# Patient Record
Sex: Male | Born: 1985 | Race: White | Hispanic: No | Marital: Married | Smoking: Never smoker
Health system: Southern US, Community
[De-identification: ages and names within clinical notes are randomized; demographics above are authoritative.]

## PROBLEM LIST (undated history)

## (undated) DIAGNOSIS — F988 Other specified behavioral and emotional disorders with onset usually occurring in childhood and adolescence: Secondary | ICD-10-CM

## (undated) DIAGNOSIS — E7800 Pure hypercholesterolemia, unspecified: Secondary | ICD-10-CM

## (undated) DIAGNOSIS — M2550 Pain in unspecified joint: Secondary | ICD-10-CM

## (undated) DIAGNOSIS — F909 Attention-deficit hyperactivity disorder, unspecified type: Secondary | ICD-10-CM

## (undated) DIAGNOSIS — R0602 Shortness of breath: Secondary | ICD-10-CM

## (undated) DIAGNOSIS — R748 Abnormal levels of other serum enzymes: Secondary | ICD-10-CM

## (undated) HISTORY — DX: Pain in unspecified joint: M25.50

## (undated) HISTORY — DX: Attention-deficit hyperactivity disorder, unspecified type: F90.9

## (undated) HISTORY — DX: Abnormal levels of other serum enzymes: R74.8

## (undated) HISTORY — DX: Other specified behavioral and emotional disorders with onset usually occurring in childhood and adolescence: F98.8

## (undated) HISTORY — DX: Pure hypercholesterolemia, unspecified: E78.00

## (undated) HISTORY — PX: KNEE SURGERY: SHX244

## (undated) HISTORY — DX: Shortness of breath: R06.02

---

## 2004-06-18 ENCOUNTER — Ambulatory Visit: Payer: Self-pay | Admitting: Internal Medicine

## 2004-06-24 ENCOUNTER — Ambulatory Visit: Payer: Self-pay | Admitting: Internal Medicine

## 2005-04-30 ENCOUNTER — Ambulatory Visit: Payer: Self-pay | Admitting: Internal Medicine

## 2006-07-26 ENCOUNTER — Ambulatory Visit: Payer: Self-pay | Admitting: Internal Medicine

## 2006-07-28 ENCOUNTER — Ambulatory Visit: Payer: Self-pay | Admitting: Internal Medicine

## 2006-12-21 ENCOUNTER — Encounter: Payer: Self-pay | Admitting: Internal Medicine

## 2006-12-24 ENCOUNTER — Ambulatory Visit: Payer: Self-pay | Admitting: Internal Medicine

## 2010-07-06 HISTORY — PX: SHOULDER SURGERY: SHX246

## 2016-07-24 ENCOUNTER — Encounter (HOSPITAL_COMMUNITY): Payer: Self-pay | Admitting: Emergency Medicine

## 2016-07-24 ENCOUNTER — Inpatient Hospital Stay (HOSPITAL_COMMUNITY)
Admission: EM | Admit: 2016-07-24 | Discharge: 2016-08-05 | DRG: 339 | Disposition: A | Discharge status: Home / Self Care | Payer: 59 | Attending: General Surgery | Admitting: General Surgery

## 2016-07-24 DIAGNOSIS — K567 Ileus, unspecified: Secondary | ICD-10-CM | POA: Diagnosis not present

## 2016-07-24 DIAGNOSIS — Z0189 Encounter for other specified special examinations: Secondary | ICD-10-CM

## 2016-07-24 DIAGNOSIS — Z6829 Body mass index (BMI) 29.0-29.9, adult: Secondary | ICD-10-CM

## 2016-07-24 DIAGNOSIS — R1110 Vomiting, unspecified: Secondary | ICD-10-CM

## 2016-07-24 DIAGNOSIS — K3580 Unspecified acute appendicitis: Secondary | ICD-10-CM | POA: Diagnosis not present

## 2016-07-24 DIAGNOSIS — K3532 Acute appendicitis with perforation and localized peritonitis, without abscess: Secondary | ICD-10-CM | POA: Diagnosis present

## 2016-07-24 DIAGNOSIS — K352 Acute appendicitis with generalized peritonitis: Principal | ICD-10-CM | POA: Diagnosis present

## 2016-07-24 DIAGNOSIS — E7800 Pure hypercholesterolemia, unspecified: Secondary | ICD-10-CM | POA: Diagnosis present

## 2016-07-24 DIAGNOSIS — Z4659 Encounter for fitting and adjustment of other gastrointestinal appliance and device: Secondary | ICD-10-CM

## 2016-07-24 DIAGNOSIS — K381 Appendicular concretions: Secondary | ICD-10-CM | POA: Diagnosis present

## 2016-07-24 DIAGNOSIS — F909 Attention-deficit hyperactivity disorder, unspecified type: Secondary | ICD-10-CM | POA: Diagnosis present

## 2016-07-24 DIAGNOSIS — K9189 Other postprocedural complications and disorders of digestive system: Secondary | ICD-10-CM

## 2016-07-24 DIAGNOSIS — K5669 Other partial intestinal obstruction: Secondary | ICD-10-CM | POA: Diagnosis not present

## 2016-07-24 DIAGNOSIS — K219 Gastro-esophageal reflux disease without esophagitis: Secondary | ICD-10-CM | POA: Diagnosis not present

## 2016-07-24 DIAGNOSIS — R112 Nausea with vomiting, unspecified: Secondary | ICD-10-CM

## 2016-07-24 LAB — URINALYSIS, ROUTINE W REFLEX MICROSCOPIC
Bacteria, UA: NONE SEEN
Bilirubin Urine: NEGATIVE
GLUCOSE, UA: NEGATIVE mg/dL
KETONES UR: 80 mg/dL — AB
Leukocytes, UA: NEGATIVE
Nitrite: NEGATIVE
PH: 5 (ref 5.0–8.0)
Protein, ur: 30 mg/dL — AB
Specific Gravity, Urine: 1.032 — ABNORMAL HIGH (ref 1.005–1.030)
Squamous Epithelial / LPF: NONE SEEN

## 2016-07-24 LAB — CBC
HCT: 45.9 % (ref 39.0–52.0)
Hemoglobin: 15.7 g/dL (ref 13.0–17.0)
MCH: 29.3 pg (ref 26.0–34.0)
MCHC: 34.2 g/dL (ref 30.0–36.0)
MCV: 85.8 fL (ref 78.0–100.0)
Platelets: 228 10*3/uL (ref 150–400)
RBC: 5.35 MIL/uL (ref 4.22–5.81)
RDW: 13 % (ref 11.5–15.5)
WBC: 17.3 10*3/uL — ABNORMAL HIGH (ref 4.0–10.5)

## 2016-07-24 MED ORDER — ONDANSETRON 4 MG PO TBDP
ORAL_TABLET | ORAL | Status: AC
  Filled 2016-07-24: qty 1

## 2016-07-24 MED ORDER — FENTANYL CITRATE (PF) 100 MCG/2ML IJ SOLN
INTRAMUSCULAR | Status: AC
  Filled 2016-07-24: qty 2

## 2016-07-24 MED ORDER — FENTANYL CITRATE (PF) 100 MCG/2ML IJ SOLN
50.0000 ug | Freq: Once | INTRAMUSCULAR | Status: AC
Start: 2016-07-24 — End: 2016-07-24
  Administered 2016-07-24: 50 ug via INTRAVENOUS

## 2016-07-24 MED ORDER — ONDANSETRON 4 MG PO TBDP
4.0000 mg | ORAL_TABLET | Freq: Once | ORAL | Status: AC | PRN
  Administered 2016-07-24: 4 mg via ORAL

## 2016-07-24 NOTE — ED Notes (Addendum)
Pt complains of dull pain to his stomach for the last week. Pt points to pain on the right side of his belly button. No rebound tenderness. Pt's last full meal was 2 days ago. Pt only able to eat small bites.

## 2016-07-24 NOTE — ED Triage Notes (Signed)
Pt presents to ED for assessment of RLQ pain x 1 week, intensifying significantly tonight.  Pt tender to palpation.  Pt denies any vomiting or diarrhea, but complains of new nausea.  Pt denies any urinary changes.

## 2016-07-24 NOTE — ED Notes (Signed)
Pt became very diaphoretic, pale, and stated he felt like he was going to pass out.  Vitals reassessed and BP 85/63.  Pt taken to room for MD assessment

## 2016-07-25 ENCOUNTER — Emergency Department (HOSPITAL_COMMUNITY): Payer: 59 | Admitting: Anesthesiology

## 2016-07-25 ENCOUNTER — Emergency Department (HOSPITAL_COMMUNITY): Payer: 59

## 2016-07-25 ENCOUNTER — Encounter (HOSPITAL_COMMUNITY): Admission: EM | Disposition: A | Discharge status: Home / Self Care | Payer: Self-pay | Source: Home / Self Care

## 2016-07-25 DIAGNOSIS — K3532 Acute appendicitis with perforation and localized peritonitis, without abscess: Secondary | ICD-10-CM | POA: Diagnosis present

## 2016-07-25 DIAGNOSIS — F909 Attention-deficit hyperactivity disorder, unspecified type: Secondary | ICD-10-CM | POA: Diagnosis present

## 2016-07-25 DIAGNOSIS — R109 Unspecified abdominal pain: Secondary | ICD-10-CM | POA: Diagnosis not present

## 2016-07-25 DIAGNOSIS — R197 Diarrhea, unspecified: Secondary | ICD-10-CM | POA: Diagnosis not present

## 2016-07-25 DIAGNOSIS — R1031 Right lower quadrant pain: Secondary | ICD-10-CM | POA: Diagnosis not present

## 2016-07-25 DIAGNOSIS — R112 Nausea with vomiting, unspecified: Secondary | ICD-10-CM | POA: Diagnosis not present

## 2016-07-25 DIAGNOSIS — E7800 Pure hypercholesterolemia, unspecified: Secondary | ICD-10-CM | POA: Diagnosis present

## 2016-07-25 DIAGNOSIS — K5669 Other partial intestinal obstruction: Secondary | ICD-10-CM | POA: Diagnosis not present

## 2016-07-25 DIAGNOSIS — Z4682 Encounter for fitting and adjustment of non-vascular catheter: Secondary | ICD-10-CM | POA: Diagnosis not present

## 2016-07-25 DIAGNOSIS — R1110 Vomiting, unspecified: Secondary | ICD-10-CM | POA: Diagnosis not present

## 2016-07-25 DIAGNOSIS — K3580 Unspecified acute appendicitis: Secondary | ICD-10-CM | POA: Diagnosis not present

## 2016-07-25 DIAGNOSIS — Z6829 Body mass index (BMI) 29.0-29.9, adult: Secondary | ICD-10-CM | POA: Diagnosis not present

## 2016-07-25 DIAGNOSIS — K381 Appendicular concretions: Secondary | ICD-10-CM | POA: Diagnosis present

## 2016-07-25 DIAGNOSIS — K352 Acute appendicitis with generalized peritonitis: Secondary | ICD-10-CM | POA: Diagnosis not present

## 2016-07-25 DIAGNOSIS — K219 Gastro-esophageal reflux disease without esophagitis: Secondary | ICD-10-CM | POA: Diagnosis not present

## 2016-07-25 HISTORY — PX: LAPAROSCOPIC APPENDECTOMY: SHX408

## 2016-07-25 LAB — COMPREHENSIVE METABOLIC PANEL
ALK PHOS: 124 U/L (ref 38–126)
ALT: 32 U/L (ref 17–63)
AST: 17 U/L (ref 15–41)
Albumin: 4.6 g/dL (ref 3.5–5.0)
Anion gap: 13 (ref 5–15)
BUN: 11 mg/dL (ref 6–20)
CO2: 25 mmol/L (ref 22–32)
Calcium: 9.7 mg/dL (ref 8.9–10.3)
Chloride: 98 mmol/L — ABNORMAL LOW (ref 101–111)
Creatinine, Ser: 1.12 mg/dL (ref 0.61–1.24)
GFR calc Af Amer: >60 mL/min (ref >60)
GLUCOSE: 112 mg/dL — AB (ref 65–99)
Potassium: 3.9 mmol/L (ref 3.5–5.1)
Sodium: 136 mmol/L (ref 135–145)
TOTAL PROTEIN: 7.4 g/dL (ref 6.5–8.1)
Total Bilirubin: 0.6 mg/dL (ref 0.3–1.2)

## 2016-07-25 LAB — LIPASE, BLOOD: Lipase: 18 U/L (ref 11–51)

## 2016-07-25 LAB — I-STAT CG4 LACTIC ACID, ED: LACTIC ACID, VENOUS: 0.88 mmol/L (ref 0.5–1.9)

## 2016-07-25 SURGERY — APPENDECTOMY, LAPAROSCOPIC
Anesthesia: General | Site: Abdomen

## 2016-07-25 MED ORDER — SUFENTANIL CITRATE 50 MCG/ML IV SOLN
INTRAVENOUS | Status: AC
  Filled 2016-07-25: qty 1

## 2016-07-25 MED ORDER — SODIUM CHLORIDE 0.9 % IR SOLN
Status: DC | PRN
  Administered 2016-07-25: 1000 mL

## 2016-07-25 MED ORDER — BUPIVACAINE-EPINEPHRINE 0.25% -1:200000 IJ SOLN
INTRAMUSCULAR | Status: DC | PRN
  Administered 2016-07-25: 14 mL

## 2016-07-25 MED ORDER — ROCURONIUM BROMIDE 50 MG/5ML IV SOSY
PREFILLED_SYRINGE | INTRAVENOUS | Status: AC
  Filled 2016-07-25: qty 5

## 2016-07-25 MED ORDER — MIDAZOLAM HCL 2 MG/2ML IJ SOLN
INTRAMUSCULAR | Status: DC | PRN
  Administered 2016-07-25: 2 mg via INTRAVENOUS

## 2016-07-25 MED ORDER — ONDANSETRON HCL 4 MG/2ML IJ SOLN
4.0000 mg | Freq: Four times a day (QID) | INTRAMUSCULAR | Status: DC | PRN
  Administered 2016-07-26 – 2016-08-02 (×10): 4 mg via INTRAVENOUS
  Filled 2016-07-25 (×12): qty 2

## 2016-07-25 MED ORDER — ONDANSETRON 4 MG PO TBDP
4.0000 mg | ORAL_TABLET | Freq: Four times a day (QID) | ORAL | Status: DC | PRN
Start: 2016-07-25 — End: 2016-08-05
  Filled 2016-07-25: qty 1

## 2016-07-25 MED ORDER — METHOCARBAMOL 500 MG PO TABS
500.0000 mg | ORAL_TABLET | Freq: Four times a day (QID) | ORAL | Status: DC | PRN
  Administered 2016-07-26: 500 mg via ORAL
  Filled 2016-07-25: qty 1

## 2016-07-25 MED ORDER — SUFENTANIL CITRATE 50 MCG/ML IV SOLN
INTRAVENOUS | Status: DC | PRN
  Administered 2016-07-25: 10 ug via INTRAVENOUS
  Administered 2016-07-25 (×2): 20 ug via INTRAVENOUS

## 2016-07-25 MED ORDER — SODIUM CHLORIDE 0.9 % IJ SOLN
INTRAMUSCULAR | Status: AC
  Filled 2016-07-25: qty 10

## 2016-07-25 MED ORDER — OXYCODONE HCL 5 MG PO TABS
5.0000 mg | ORAL_TABLET | ORAL | Status: DC | PRN
  Administered 2016-07-25 – 2016-07-26 (×3): 10 mg via ORAL
  Administered 2016-07-27: 5 mg via ORAL
  Administered 2016-07-27 – 2016-08-02 (×4): 10 mg via ORAL
  Filled 2016-07-25 (×3): qty 2
  Filled 2016-07-25: qty 1
  Filled 2016-07-25 (×6): qty 2

## 2016-07-25 MED ORDER — MORPHINE SULFATE (PF) 4 MG/ML IV SOLN
4.0000 mg | INTRAVENOUS | Status: DC | PRN
  Administered 2016-07-25 (×2): 4 mg via INTRAVENOUS
  Filled 2016-07-25 (×2): qty 1

## 2016-07-25 MED ORDER — ZOLPIDEM TARTRATE 5 MG PO TABS: 5.0000 mg | ORAL_TABLET | Freq: Every evening | ORAL | Status: DC | PRN

## 2016-07-25 MED ORDER — IOPAMIDOL (ISOVUE-300) INJECTION 61%
INTRAVENOUS | Status: AC
  Administered 2016-07-25: 100 mL
  Filled 2016-07-25: qty 100

## 2016-07-25 MED ORDER — ONDANSETRON HCL 4 MG/2ML IJ SOLN
INTRAMUSCULAR | Status: DC | PRN
  Administered 2016-07-25: 4 mg via INTRAVENOUS

## 2016-07-25 MED ORDER — SUCCINYLCHOLINE CHLORIDE 200 MG/10ML IV SOSY
PREFILLED_SYRINGE | INTRAVENOUS | Status: AC
  Filled 2016-07-25: qty 10

## 2016-07-25 MED ORDER — PIPERACILLIN-TAZOBACTAM 3.375 G IVPB
3.3750 g | Freq: Three times a day (TID) | INTRAVENOUS | Status: AC
  Administered 2016-07-25 – 2016-07-29 (×12): 3.375 g via INTRAVENOUS
  Filled 2016-07-25 (×12): qty 50

## 2016-07-25 MED ORDER — LIDOCAINE 2% (20 MG/ML) 5 ML SYRINGE
INTRAMUSCULAR | Status: AC
  Filled 2016-07-25: qty 5

## 2016-07-25 MED ORDER — BUPIVACAINE HCL (PF) 0.25 % IJ SOLN
INTRAMUSCULAR | Status: AC
  Filled 2016-07-25: qty 30

## 2016-07-25 MED ORDER — 0.9 % SODIUM CHLORIDE (POUR BTL) OPTIME
TOPICAL | Status: DC | PRN
  Administered 2016-07-25: 1000 mL

## 2016-07-25 MED ORDER — SUGAMMADEX SODIUM 200 MG/2ML IV SOLN
INTRAVENOUS | Status: DC | PRN
  Administered 2016-07-25: 200 mg via INTRAVENOUS

## 2016-07-25 MED ORDER — HYDROMORPHONE HCL 1 MG/ML IJ SOLN
0.2500 mg | INTRAMUSCULAR | Status: DC | PRN
  Administered 2016-07-25 (×2): 0.5 mg via INTRAVENOUS

## 2016-07-25 MED ORDER — MEPERIDINE HCL 25 MG/ML IJ SOLN: 6.2500 mg | INTRAMUSCULAR | Status: DC | PRN

## 2016-07-25 MED ORDER — PROPOFOL 10 MG/ML IV BOLUS
INTRAVENOUS | Status: AC
  Filled 2016-07-25: qty 20

## 2016-07-25 MED ORDER — ONDANSETRON HCL 4 MG/2ML IJ SOLN
INTRAMUSCULAR | Status: AC
  Filled 2016-07-25: qty 2

## 2016-07-25 MED ORDER — SUGAMMADEX SODIUM 200 MG/2ML IV SOLN
INTRAVENOUS | Status: AC
  Filled 2016-07-25: qty 2

## 2016-07-25 MED ORDER — ONDANSETRON HCL 4 MG/2ML IJ SOLN
4.0000 mg | Freq: Once | INTRAMUSCULAR | Status: AC
  Administered 2016-07-25: 4 mg via INTRAVENOUS
  Filled 2016-07-25: qty 2

## 2016-07-25 MED ORDER — ACETAMINOPHEN 500 MG PO TABS
1000.0000 mg | ORAL_TABLET | Freq: Four times a day (QID) | ORAL | Status: DC
  Administered 2016-07-25 – 2016-08-04 (×29): 1000 mg via ORAL
  Filled 2016-07-25 (×29): qty 2

## 2016-07-25 MED ORDER — ATORVASTATIN CALCIUM 20 MG PO TABS
20.0000 mg | ORAL_TABLET | Freq: Every day | ORAL | Status: DC
  Administered 2016-07-25 – 2016-08-05 (×9): 20 mg via ORAL
  Filled 2016-07-25 (×10): qty 1

## 2016-07-25 MED ORDER — POLYETHYLENE GLYCOL 3350 17 G PO PACK: 17.0000 g | PACK | Freq: Every day | ORAL | Status: DC | PRN

## 2016-07-25 MED ORDER — ROCURONIUM BROMIDE 100 MG/10ML IV SOLN
INTRAVENOUS | Status: DC | PRN
  Administered 2016-07-25: 30 mg via INTRAVENOUS
  Administered 2016-07-25: 20 mg via INTRAVENOUS

## 2016-07-25 MED ORDER — LACTATED RINGERS IV SOLN
INTRAVENOUS | Status: DC | PRN
  Administered 2016-07-25 (×2): via INTRAVENOUS

## 2016-07-25 MED ORDER — PIPERACILLIN-TAZOBACTAM 3.375 G IVPB
3.3750 g | Freq: Once | INTRAVENOUS | Status: AC
  Administered 2016-07-25: 3.375 g via INTRAVENOUS
  Filled 2016-07-25: qty 50

## 2016-07-25 MED ORDER — AMPHETAMINE-DEXTROAMPHET ER 10 MG PO CP24: 10.0000 mg | ORAL_CAPSULE | Freq: Every day | ORAL | Status: DC | PRN

## 2016-07-25 MED ORDER — SODIUM CHLORIDE 0.9 % IV BOLUS (SEPSIS)
1000.0000 mL | Freq: Once | INTRAVENOUS | Status: AC
  Administered 2016-07-25: 1000 mL via INTRAVENOUS

## 2016-07-25 MED ORDER — SUCCINYLCHOLINE CHLORIDE 20 MG/ML IJ SOLN
INTRAMUSCULAR | Status: DC | PRN
  Administered 2016-07-25: 140 mg via INTRAVENOUS

## 2016-07-25 MED ORDER — MIDAZOLAM HCL 2 MG/2ML IJ SOLN
INTRAMUSCULAR | Status: AC
  Filled 2016-07-25: qty 2

## 2016-07-25 MED ORDER — HYDROMORPHONE HCL 2 MG/ML IJ SOLN
1.0000 mg | INTRAMUSCULAR | Status: DC | PRN
  Administered 2016-07-25 – 2016-07-26 (×8): 2 mg via INTRAVENOUS
  Filled 2016-07-25 (×9): qty 1

## 2016-07-25 MED ORDER — DEXTROSE 5 % IV SOLN
2.0000 g | Freq: Once | INTRAVENOUS | Status: AC
  Administered 2016-07-25: 2 g via INTRAVENOUS
  Filled 2016-07-25: qty 2

## 2016-07-25 MED ORDER — DOCUSATE SODIUM 100 MG PO CAPS
100.0000 mg | ORAL_CAPSULE | Freq: Two times a day (BID) | ORAL | Status: DC
  Administered 2016-07-25 – 2016-07-28 (×7): 100 mg via ORAL
  Filled 2016-07-25 (×8): qty 1

## 2016-07-25 MED ORDER — LIDOCAINE HCL (CARDIAC) 20 MG/ML IV SOLN
INTRAVENOUS | Status: DC | PRN
  Administered 2016-07-25: 100 mg via INTRATRACHEAL

## 2016-07-25 MED ORDER — ENOXAPARIN SODIUM 40 MG/0.4ML ~~LOC~~ SOLN
40.0000 mg | SUBCUTANEOUS | Status: DC
  Administered 2016-07-26 – 2016-08-04 (×10): 40 mg via SUBCUTANEOUS
  Filled 2016-07-25 (×11): qty 0.4

## 2016-07-25 MED ORDER — PROPOFOL 10 MG/ML IV BOLUS
INTRAVENOUS | Status: DC | PRN
  Administered 2016-07-25: 150 mg via INTRAVENOUS

## 2016-07-25 MED ORDER — HYDROMORPHONE HCL 1 MG/ML IJ SOLN
INTRAMUSCULAR | Status: AC
  Filled 2016-07-25: qty 1

## 2016-07-25 MED ORDER — ONDANSETRON HCL 4 MG/2ML IJ SOLN: 4.0000 mg | Freq: Once | INTRAMUSCULAR | Status: DC | PRN

## 2016-07-25 SURGICAL SUPPLY — 51 items
ADH SKN CLS APL DERMABOND .7 (GAUZE/BANDAGES/DRESSINGS) ×1
APPLIER CLIP ROT 10 11.4 M/L (STAPLE)
APR CLP MED LRG 11.4X10 (STAPLE)
BAG SPEC RTRVL LRG 6X4 10 (ENDOMECHANICALS) ×2
BLADE SURG ROTATE 9660 (MISCELLANEOUS) ×1 IMPLANT
CANISTER SUCTION 2500CC (MISCELLANEOUS) ×2 IMPLANT
CHLORAPREP W/TINT 26ML (MISCELLANEOUS) ×2 IMPLANT
CLIP APPLIE ROT 10 11.4 M/L (STAPLE) IMPLANT
CLOSURE STERI-STRIP 1/4X4 (GAUZE/BANDAGES/DRESSINGS) ×1 IMPLANT
COVER SURGICAL LIGHT HANDLE (MISCELLANEOUS) ×2 IMPLANT
CUTTER FLEX LINEAR 45M (STAPLE) ×2 IMPLANT
DERMABOND ADVANCED (GAUZE/BANDAGES/DRESSINGS) ×1
DERMABOND ADVANCED .7 DNX12 (GAUZE/BANDAGES/DRESSINGS) ×1 IMPLANT
DRSG TEGADERM 2-3/8X2-3/4 SM (GAUZE/BANDAGES/DRESSINGS) ×6 IMPLANT
DRSG TEGADERM 4X4.75 (GAUZE/BANDAGES/DRESSINGS) ×1 IMPLANT
ELECT REM PT RETURN 9FT ADLT (ELECTROSURGICAL) ×2
ELECTRODE REM PT RTRN 9FT ADLT (ELECTROSURGICAL) ×1 IMPLANT
ENDOLOOP SUT PDS II  0 18 (SUTURE)
ENDOLOOP SUT PDS II 0 18 (SUTURE) IMPLANT
GLOVE BIO SURGEON STRL SZ 6.5 (GLOVE) ×2 IMPLANT
GLOVE BIOGEL PI IND STRL 6.5 (GLOVE) IMPLANT
GLOVE BIOGEL PI IND STRL 8 (GLOVE) ×1 IMPLANT
GLOVE BIOGEL PI INDICATOR 6.5 (GLOVE) ×1
GLOVE BIOGEL PI INDICATOR 8 (GLOVE) ×1
GLOVE ECLIPSE 7.5 STRL STRAW (GLOVE) ×2 IMPLANT
GOWN STRL REUS W/ TWL LRG LVL3 (GOWN DISPOSABLE) ×3 IMPLANT
GOWN STRL REUS W/TWL LRG LVL3 (GOWN DISPOSABLE) ×6
IV NS 1000ML (IV SOLUTION) ×6
IV NS 1000ML BAXH (IV SOLUTION) IMPLANT
KIT BASIN OR (CUSTOM PROCEDURE TRAY) ×2 IMPLANT
KIT ROOM TURNOVER OR (KITS) ×2 IMPLANT
NS IRRIG 1000ML POUR BTL (IV SOLUTION) ×2 IMPLANT
PAD ARMBOARD 7.5X6 YLW CONV (MISCELLANEOUS) ×4 IMPLANT
POUCH SPECIMEN RETRIEVAL 10MM (ENDOMECHANICALS) ×3 IMPLANT
RELOAD 45 VASCULAR/THIN (ENDOMECHANICALS) IMPLANT
RELOAD STAPLE 45 2.5 WHT GRN (ENDOMECHANICALS) IMPLANT
RELOAD STAPLE 45 3.5 BLU ETS (ENDOMECHANICALS) IMPLANT
RELOAD STAPLE TA45 3.5 REG BLU (ENDOMECHANICALS) ×6 IMPLANT
SCALPEL HARMONIC ACE (MISCELLANEOUS) ×2 IMPLANT
SET IRRIG TUBING LAPAROSCOPIC (IRRIGATION / IRRIGATOR) ×2 IMPLANT
SLEEVE ENDOPATH XCEL 5M (ENDOMECHANICALS) ×2 IMPLANT
SPECIMEN JAR SMALL (MISCELLANEOUS) ×2 IMPLANT
STRIP CLOSURE SKIN 1/2X4 (GAUZE/BANDAGES/DRESSINGS) ×2 IMPLANT
SUT MNCRL AB 4-0 PS2 18 (SUTURE) ×2 IMPLANT
TOWEL OR 17X24 6PK STRL BLUE (TOWEL DISPOSABLE) ×2 IMPLANT
TOWEL OR 17X26 10 PK STRL BLUE (TOWEL DISPOSABLE) ×2 IMPLANT
TRAY FOLEY CATH 16FR SILVER (SET/KITS/TRAYS/PACK) ×2 IMPLANT
TRAY LAPAROSCOPIC MC (CUSTOM PROCEDURE TRAY) ×2 IMPLANT
TROCAR XCEL BLUNT TIP 100MML (ENDOMECHANICALS) ×2 IMPLANT
TROCAR XCEL NON-BLD 5MMX100MML (ENDOMECHANICALS) ×2 IMPLANT
TUBING INSUFFLATION (TUBING) ×2 IMPLANT

## 2016-07-25 NOTE — Anesthesia Preprocedure Evaluation (Signed)
Anesthesia Evaluation  Patient identified by MRN, date of birth, ID band Patient awake    Reviewed: Allergy & Precautions, NPO status , Patient's Chart, lab work & pertinent test results  Airway Mallampati: I  TM Distance: >3 FB Neck ROM: Full    Dental   Pulmonary    Pulmonary exam normal       Cardiovascular Normal cardiovascular exam    Neuro/Psych    GI/Hepatic   Endo/Other    Renal/GU      Musculoskeletal   Abdominal   Peds  Hematology   Anesthesia Other Findings   Reproductive/Obstetrics                             Anesthesia Physical Anesthesia Plan  ASA: II and emergent  Anesthesia Plan: General   Post-op Pain Management:    Induction: Intravenous, Rapid sequence and Cricoid pressure planned  Airway Management Planned: Oral ETT  Additional Equipment:   Intra-op Plan:   Post-operative Plan: Extubation in OR  Informed Consent: I have reviewed the patients History and Physical, chart, labs and discussed the procedure including the risks, benefits and alternatives for the proposed anesthesia with the patient or authorized representative who has indicated his/her understanding and acceptance.     Plan Discussed with: CRNA and Surgeon  Anesthesia Plan Comments:         Anesthesia Quick Evaluation  

## 2016-07-25 NOTE — H&P (Signed)
Ronnie Jordan is an 31 y.o. male.   Chief Complaint: Abdominal pain, acute appendicitis HPI: Patient has been sick for over one week with abdominal pain, worsened so much the last 24 hours that he came to the ED where he was suspectd of having acute appendicitis.  CT scan confirmed the diagnosis with possible perforation.  History reviewed. No pertinent past medical history.  History reviewed. No pertinent surgical history.  History reviewed. No pertinent family history. Social History:  reports that he has never smoked. He has never used smokeless tobacco. He reports that he does not drink alcohol or use drugs.  Allergies: No Known Allergies   (Not in a hospital admission)  Results for orders placed or performed during the hospital encounter of 07/24/16 (from the past 48 hour(s))  Lipase, blood     Status: None   Collection Time: 07/24/16 10:59 PM  Result Value Ref Range   Lipase 18 11 - 51 U/L  Comprehensive metabolic panel     Status: Abnormal   Collection Time: 07/24/16 10:59 PM  Result Value Ref Range   Sodium 136 135 - 145 mmol/L   Potassium 3.9 3.5 - 5.1 mmol/L   Chloride 98 (L) 101 - 111 mmol/L   CO2 25 22 - 32 mmol/L   Glucose, Bld 112 (H) 65 - 99 mg/dL   BUN 11 6 - 20 mg/dL   Creatinine, Ser 1.12 0.61 - 1.24 mg/dL   Calcium 9.7 8.9 - 10.3 mg/dL   Total Protein 7.4 6.5 - 8.1 g/dL   Albumin 4.6 3.5 - 5.0 g/dL   AST 17 15 - 41 U/L   ALT 32 17 - 63 U/L   Alkaline Phosphatase 124 38 - 126 U/L   Total Bilirubin 0.6 0.3 - 1.2 mg/dL   GFR calc non Af Amer >60 >60 mL/min   GFR calc Af Amer >60 >60 mL/min    Comment: (NOTE) The eGFR has been calculated using the CKD EPI equation. This calculation has not been validated in all clinical situations. eGFR's persistently <60 mL/min signify possible Chronic Kidney Disease.    Anion gap 13 5 - 15  CBC     Status: Abnormal   Collection Time: 07/24/16 10:59 PM  Result Value Ref Range   WBC 17.3 (H) 4.0 - 10.5 K/uL   RBC 5.35  4.22 - 5.81 MIL/uL   Hemoglobin 15.7 13.0 - 17.0 g/dL   HCT 45.9 39.0 - 52.0 %   MCV 85.8 78.0 - 100.0 fL   MCH 29.3 26.0 - 34.0 pg   MCHC 34.2 30.0 - 36.0 g/dL   RDW 13.0 11.5 - 15.5 %   Platelets 228 150 - 400 K/uL  Urinalysis, Routine w reflex microscopic     Status: Abnormal   Collection Time: 07/24/16 11:01 PM  Result Value Ref Range   Color, Urine YELLOW YELLOW   APPearance CLEAR CLEAR   Specific Gravity, Urine 1.032 (H) 1.005 - 1.030   pH 5.0 5.0 - 8.0   Glucose, UA NEGATIVE NEGATIVE mg/dL   Hgb urine dipstick SMALL (A) NEGATIVE   Bilirubin Urine NEGATIVE NEGATIVE   Ketones, ur 80 (A) NEGATIVE mg/dL   Protein, ur 30 (A) NEGATIVE mg/dL   Nitrite NEGATIVE NEGATIVE   Leukocytes, UA NEGATIVE NEGATIVE   RBC / HPF 0-5 0 - 5 RBC/hpf   WBC, UA 0-5 0 - 5 WBC/hpf   Bacteria, UA NONE SEEN NONE SEEN   Squamous Epithelial / LPF NONE SEEN NONE SEEN  Mucous PRESENT   I-Stat CG4 Lactic Acid, ED     Status: None   Collection Time: 07/25/16 12:37 AM  Result Value Ref Range   Lactic Acid, Venous 0.88 0.5 - 1.9 mmol/L   Ct Abdomen Pelvis W Contrast  Result Date: 07/25/2016 CLINICAL DATA:  Abdominal pain with hypotension EXAM: CT ABDOMEN AND PELVIS WITH CONTRAST TECHNIQUE: Multidetector CT imaging of the abdomen and pelvis was performed using the standard protocol following bolus administration of intravenous contrast. CONTRAST:  160m ISOVUE-300 IOPAMIDOL (ISOVUE-300) INJECTION 61% COMPARISON:  None. FINDINGS: Lower chest: Lung bases demonstrate no acute consolidation or pleural effusion. Normal heart size. Hepatobiliary: Subcentimeter hypodense lesion in the right hepatic lobe too small to further characterize. Small focal fat infiltration near the falciform ligament. No biliary dilatation. No calcified gallstones. Pancreas: Unremarkable. No pancreatic ductal dilatation or surrounding inflammatory changes. Spleen: Normal in size without focal abnormality. Adrenals/Urinary Tract: Adrenal glands  are unremarkable. Kidneys are normal, without renal calculi, focal lesion, or hydronephrosis. Bladder is unremarkable. Stomach/Bowel: Stomach is nonenlarged. There is no dilated small bowel. Marked inflammatory process in the right lower quadrant. The appendix is enlarged, measuring up to 1.7 cm. There is a 6 mm appendicolith within the proximal lumen of the appendix. No definite extraluminal gas. Thickening of the adjacent distal small bowel, likely reactive. Vascular/Lymphatic: Enlarge right lower quadrant mesenteric lymph nodes. Reproductive: Prostate is unremarkable. Other: Small free fluid in the pelvis.  No free air Musculoskeletal: No acute or significant osseous findings. IMPRESSION: 1. Enlarged appendix measuring up to 17 mm with moderate to marked inflammatory changes in the right lower quadrant, findings would be consistent with appendicitis. There is a 6 mm at appendicolith in the proximal appendiceal lumen. No definite extraluminal gas is seen in the right lower quadrant. 2. Small amount of free fluid in the pelvis Critical Value/emergent results were called by telephone at the time of interpretation on 07/25/2016 at 1:54 am to Dr. HAbigail Butts, who verbally acknowledged these results. Electronically Signed   By: KDonavan FoilM.D.   On: 07/25/2016 01:54    Review of Systems  Constitutional: Positive for chills and fever.  Gastrointestinal: Positive for abdominal pain and nausea.  All other systems reviewed and are negative.   Blood pressure 134/80, pulse 92, temperature 99.1 F (37.3 C), temperature source Oral, resp. rate 19, height '5\' 11"'$  (1.803 m), weight 88.1 kg (194 lb 4 oz), SpO2 99 %. Physical Exam  Nursing note and vitals reviewed. Constitutional: He is oriented to person, place, and time. He appears well-developed and well-nourished.  HENT:  Head: Normocephalic and atraumatic.  Eyes: Conjunctivae and EOM are normal. Pupils are equal, round, and reactive to light.  Neck:  Normal range of motion. Neck supple.  Cardiovascular: Normal rate, regular rhythm, normal heart sounds and intact distal pulses.   Respiratory: Effort normal and breath sounds normal.  GI: Soft. Normal appearance and bowel sounds are normal. He exhibits no distension. There is tenderness in the right upper quadrant and right lower quadrant. There is rebound (Has a positive Rovsing's sign), guarding and tenderness at McBurney's point (maybe a bit lower). There is no rigidity.  Musculoskeletal: Normal range of motion.  Neurological: He is alert and oriented to person, place, and time. He has normal reflexes.  Skin: Skin is warm and dry.  Psychiatric: He has a normal mood and affect. His behavior is normal. Judgment and thought content normal.     Assessment/Plan Acute appendicitis with possible early rupture and  an appendicolith OR ASAP laparoscopic appendectomy. Zosyn and Rocephin ordered for the OR.  Judeth Horn, MD 07/25/2016, 2:33 AM

## 2016-07-25 NOTE — Anesthesia Postprocedure Evaluation (Signed)
Anesthesia Post Note  Patient: Ronnie Jordan  Procedure(s) Performed: Procedure(s) (LRB): APPENDECTOMY LAPAROSCOPIC (N/A)  Patient location during evaluation: PACU Anesthesia Type: General Level of consciousness: awake and alert Pain management: pain level controlled Vital Signs Assessment: post-procedure vital signs reviewed and stable Respiratory status: spontaneous breathing, nonlabored ventilation, respiratory function stable and patient connected to nasal cannula oxygen Cardiovascular status: blood pressure returned to baseline and stable Postop Assessment: no signs of nausea or vomiting Anesthetic complications: no       Last Vitals:  Vitals:   07/25/16 0530 07/25/16 0613  BP: 120/79 117/66  Pulse:  93  Resp:    Temp: 37.6 C 36.9 C    Last Pain:  Vitals:   07/25/16 0613  TempSrc: Oral  PainSc:                  Lanee Chain DAVID

## 2016-07-25 NOTE — Op Note (Signed)
OPERATIVE REPORT  DATE OF OPERATION:  07/25/2016  PATIENT:  Ronnie Jordan  31 y.o. male  PRE-OPERATIVE DIAGNOSIS:  Acute appendicitis  POST-OPERATIVE DIAGNOSIS:  Acute appendicitis  INDICATION(S) FOR OPERATION:  Abdominal pain for one week with CT scan positive for acute appendicitis and possible rupture  FINDINGS:  Rupture acute suppurative appendicitis  PROCEDURE:  Procedure(s): APPENDECTOMY LAPAROSCOPIC  SURGEON:  Surgeon(s): Ronnie Norman, MD  ASSISTANT: None  ANESTHESIA:   general  COMPLICATIONS:  RUpture  EBL: 25-50 ml  BLOOD ADMINISTERED: none  DRAINS: none   SPECIMEN:  Source of Specimen:  Appendix  COUNTS CORRECT:  YES  PROCEDURE DETAILS: The patient was taken to the operating room and placed on the table in the supine position. After an adequate general endotracheal anesthetic was administered, he was prepped and draped in the usual sterile manner exposing his abdomen.  A proper timeout was performed identifying the patient and the procedure to be performed. We started with a supraumbilical midline incision approximately 1 cm long down into the subcutaneous tissue. We dissected down to the midline fascia which was subsequently incised with a 15 blade. We subsequently bluntly dissected down into the peritoneal cavity with a Kelly clamp. A pursestring suture of 0 Vicryl was passed around the fascial opening which secured in place a Hassan cannula. Through the [facility] cannula carbon dioxide gas was insufflated into the peritoneal cavity up to a maximal intra-abdominal pressure of 15 mmHg.  We subsequently placed two 5 mm cannulas one in the right upper quadrant and one in the left lower quadrant . With all cannulas in place the patient was placed in Trendelenburg position and the left side was tilted down.  There was omentum adherent to the anterior surface of the appendix that was also densely attached to the lateral abdominal wall and posteriorly. It was not a  retrocecal appendix but the inflammatory adhesions were very dense. Also the terminal ileum partly covered the appendix on that side as there was an abscess cavity covering an area of rupture in the mid to distal portion of the appendix. Associated with that were several pieces of an appendicolith there are floating in the milieu of pus.  We dissected the appendix away from the lateral wall and posteriorly and also bluntly dissected the terminal ileum away from the medial aspect of the appendix. The rupture was in the mid to distal portion of the appendix and we were able to dissect out the appendix just proximal to that taking down the mesoappendix along with that.  We subsequently used a blue cartridge Endo GIA to come across appendix just proximal to the area of rupture. Upon inspecting that area after removing this portion of the appendix using an Endo Catch bag, it was noted that there was residual appendix left which we had to go back and take down using 2 more blue cartridge Endo GIA's. Removing the previously rupture port allowed [place] to visualize the base of the appendix at the cecum much more clearly than initially.  The second blue cartridge Endo GIA was taken across the more proximal portion of the appendix but this still left a residual stump of approximately 2 cm. A third Endo GIA was fired across the base of the appendix at the cecum providing a flushed detachment of the appendix.  Once this was done we inspected the area for bleeding and any residual abscess cavities. There was minimal bleeding as we used the harmonic scalpel for dissecting out the mesoappendix. Was also no  residual purulent areas.  We irrigated this area with 3 L of saline solution prior to closure. We attempted aspirated all fluid and gas from the pelvis and above the liver prior to closure. The patient was placed in the neutral position prior to moving in the cannulas. We initially removed the supraumbilical cannula and  tied off the fascia using the pursestring suture which was in place.  Once this was done we aspirated all fluid and gas from above the liver. Removed all cannulas.  We irrigated the wounds with saline solution. The supraumbilical skin was closed using a running subcuticular stitch of 4-0 Monocryl. 0.25% Marcaine with epinephrine was injected at all sites. The closures were done were Dermabond Steri-Strips and Tegaderm. All needle counts, sponge counts, and instrument counts were correct.  PATIENT DISPOSITION:  PACU - hemodynamically stable.   Ronnie Jordan 1/20/20185:08 AM

## 2016-07-25 NOTE — ED Provider Notes (Signed)
[facility]-EMERGENCY DEPT Provider Note   CSN: [id number] Arrival date & time: 07/24/16  2248     History   Chief Complaint Chief Complaint  Patient presents with  . Abdominal Pain    HPI Ronnie Jordan is a 31 y.o. male with a hx of ADHD, high cholesterol presents to the Emergency Department complaining of gradual, persistent, progressively worsening RLQ abd pain that began periumbilical and moved distal onset 1 week ago. Associated symptoms include vomiting (NBNB), chills.  No treatments PTA.  Nothing makes it better and movement, palpation makes it worse.  Pt denies fever, chest pain, SOB, diarrhea, constipation.  Pt reports no radiation of the pain. Pt denies previous abd surgeries.      The history is provided by the patient and medical records. No language interpreter was used.    History reviewed. No pertinent past medical history.  There are no active problems to display for this patient.   History reviewed. No pertinent surgical history.     Home Medications    Prior to Admission medications   Medication Sig Start Date End Date Taking? Authorizing Provider  amphetamine-dextroamphetamine (ADDERALL XR) 10 MG 24 hr capsule Take 10 mg by mouth daily as needed (ADHD).   Yes Historical Provider, MD  atorvastatin (LIPITOR) 20 MG tablet Take 20 mg by mouth daily.   Yes Historical Provider, MD    Family History History reviewed. No pertinent family history.  Social History Social History  Substance Use Topics  . Smoking status: Never Smoker  . Smokeless tobacco: Never Used  . Alcohol use No     Allergies   Patient has no known allergies.   Review of Systems Review of Systems  Gastrointestinal: Positive for abdominal pain (RLQ), nausea and vomiting.  All other systems reviewed and are negative.    Physical Exam Updated Vital Signs BP 125/75 (BP Location: Right Arm)   Pulse 101   Temp 99.1 F (37.3 C) (Oral)   Resp 18   Ht 5\' 11"  (1.803 m)   Wt 88.1 kg    SpO2 98%   BMI 27.09 kg/m   Physical Exam  Constitutional: He appears well-developed and well-nourished. No distress.  Awake, alert, nontoxic appearance  HENT:  Head: Normocephalic and atraumatic.  Mouth/Throat: Oropharynx is clear and moist. No oropharyngeal exudate.  Eyes: Conjunctivae are normal. No scleral icterus.  Neck: Normal range of motion. Neck supple.  Cardiovascular: Normal rate, regular rhythm and intact distal pulses.   Pulmonary/Chest: Effort normal and breath sounds normal. No respiratory distress. He has no wheezes.  Equal chest expansion  Abdominal: Soft. Bowel sounds are normal. He exhibits no distension and no mass. There is tenderness. There is rebound, guarding and tenderness at McBurney's point. There is no rigidity and negative Murphy's sign.  Positive psoas and obturator signs  Musculoskeletal: Normal range of motion. He exhibits no edema.  Neurological: He is alert.  Speech is clear and goal oriented Moves extremities without ataxia  Skin: Skin is warm and dry. He is not diaphoretic.  Psychiatric: He has a normal mood and affect.  Nursing note and vitals reviewed.    ED Treatments / Results  Labs (all labs ordered are listed, but only abnormal results are displayed) Labs Reviewed  COMPREHENSIVE METABOLIC PANEL - Abnormal; Notable for the following:       Result Value   Chloride 98 (*)    Glucose, Bld 112 (*)    All other components within normal limits  CBC - Abnormal; Notable  for the following:    WBC 17.3 (*)    All other components within normal limits  URINALYSIS, ROUTINE W REFLEX MICROSCOPIC - Abnormal; Notable for the following:    Specific Gravity, Urine 1.032 (*)    Hgb urine dipstick SMALL (*)    Ketones, ur 80 (*)    Protein, ur 30 (*)    All other components within normal limits  LIPASE, BLOOD  I-STAT CG4 LACTIC ACID, ED    Radiology Ct Abdomen Pelvis W Contrast  Result Date: 07/25/2016 CLINICAL DATA:  Abdominal pain with  hypotension EXAM: CT ABDOMEN AND PELVIS WITH CONTRAST TECHNIQUE: Multidetector CT imaging of the abdomen and pelvis was performed using the standard protocol following bolus administration of intravenous contrast. CONTRAST:  ISOVUE-300 IOPAMIDOL (ISOVUE-300) INJECTION 61% COMPARISON:  None. FINDINGS: Lower chest: Lung bases demonstrate no acute consolidation or pleural effusion. Normal heart size. Hepatobiliary: Subcentimeter hypodense lesion in the right hepatic lobe too small to further characterize. Small focal fat infiltration near the falciform ligament. No biliary dilatation. No calcified gallstones. Pancreas: Unremarkable. No pancreatic ductal dilatation or surrounding inflammatory changes. Spleen: Normal in size without focal abnormality. Adrenals/Urinary Tract: Adrenal glands are unremarkable. Kidneys are normal, without renal calculi, focal lesion, or hydronephrosis. Bladder is unremarkable. Stomach/Bowel: Stomach is nonenlarged. There is no dilated small bowel. Marked inflammatory process in the right lower quadrant. The appendix is enlarged, measuring up to 1.7 cm. There is a 6 mm appendicolith within the proximal lumen of the appendix. No definite extraluminal gas. Thickening of the adjacent distal small bowel, likely reactive. Vascular/Lymphatic: Enlarge right lower quadrant mesenteric lymph nodes. Reproductive: Prostate is unremarkable. Other: Small free fluid in the pelvis.  No free air Musculoskeletal: No acute or significant osseous findings. IMPRESSION: 1. Enlarged appendix measuring up to 17 mm with moderate to marked inflammatory changes in the right lower quadrant, findings would be consistent with appendicitis. There is a 6 mm at appendicolith in the proximal appendiceal lumen. No definite extraluminal gas is seen in the right lower quadrant. 2. Small amount of free fluid in the pelvis Critical Value/emergent results were called by telephone at the time of interpretation on 07/25/2016 at  1:54 am to Dr. Dierdre Forth , who verbally acknowledged these results. Electronically Signed   By: Jasmine Pang M.D.   On: 07/25/2016 01:54    Procedures Procedures (including critical care time)  Medications Ordered in ED Medications  morphine 4 MG/ML injection 4 mg (4 mg Intravenous Given 07/25/16 0230)  cefTRIAXone (ROCEPHIN) 2 g in dextrose 5 % 50 mL IVPB (not administered)    And  piperacillin-tazobactam (ZOSYN) IVPB 3.375 g (not administered)  fentaNYL (SUBLIMAZE) injection 50 mcg (50 mcg Intravenous Given 07/24/16 2302)  ondansetron (ZOFRAN-ODT) disintegrating tablet 4 mg (4 mg Oral Given 07/24/16 2301)  sodium chloride 0.9 % bolus 1,000 mL (1,000 mLs Intravenous New Bag/Given 07/25/16 0046)  iopamidol (ISOVUE-300) 61 % injection (100 mLs  Contrast Given 07/25/16 0054)  ondansetron (ZOFRAN) injection 4 mg (4 mg Intravenous Given 07/25/16 0113)     Initial Impression / Assessment and Plan / ED Course  I have reviewed the triage vital signs and the nursing notes.  Pertinent labs & imaging results that were available during my care of the patient were reviewed by me and considered in my medical decision making (see chart for details).  Clinical Course as of Jul 25 230  Sat Jul 25, 2016  0230 Discussed with Dr. Lindie Spruce who will admit.    [HM]  0230 Leukocytosis WBC: (!) 17.3 [HM]    Clinical Course User Index [HM] Ashawnti Tangen, PA-C    Pt with RLQ abd pain, nausea and vomiting.  1 episode of hypotension after Fentanyl administration in ED. CT with appendicitis.  NPO since noon.  Abx started.  General surgery to admit.    Final Clinical Impressions(s) / ED Diagnoses   Final diagnoses:  Acute appendicitis, unspecified acute appendicitis type    New Prescriptions New Prescriptions   No medications on file     Dierdre ForthHannah Lahela Woodin, PA-C 07/25/16 [id number]    Devoria AlbeIva Knapp, MD 07/25/16 0236

## 2016-07-25 NOTE — Anesthesia Procedure Notes (Signed)
Procedure Name: Intubation Date/Time: 07/25/2016 3:25 AM Performed by: Deshane Cotroneo S Pre-anesthesia Checklist: Patient identified, Emergency Drugs available, Suction available, Patient being monitored and Timeout performed Patient Re-evaluated:Patient Re-evaluated prior to inductionOxygen Delivery Method: Circle system utilized Preoxygenation: Pre-oxygenation with 100% oxygen Intubation Type: IV induction, Rapid sequence and Cricoid Pressure applied Ventilation: Mask ventilation without difficulty Laryngoscope Size: Mac and 4 Grade View: Grade I Tube type: Oral Tube size: 7.5 mm Number of attempts: 1 Airway Equipment and Method: Stylet Placement Confirmation: ETT inserted through vocal cords under direct vision,  positive ETCO2 and breath sounds checked- equal and bilateral Secured at: 23 cm Tube secured with: Tape Dental Injury: Teeth and Oropharynx as per pre-operative assessment

## 2016-07-25 NOTE — Transfer of Care (Signed)
Immediate Anesthesia Transfer of Care Note  Patient: Ronnie Jordan  Procedure(s) Performed: Procedure(s): APPENDECTOMY LAPAROSCOPIC (N/A)  Patient Location: PACU  Anesthesia Type:General  Level of Consciousness: awake, alert  and oriented  Airway & Oxygen Therapy: Patient Spontanous Breathing and Patient connected to face mask oxygen  Post-op Assessment: Report given to RN and Post -op Vital signs reviewed and stable  Post vital signs: Reviewed and stable  Last Vitals:  Vitals:   07/25/16 0215 07/25/16 0230  BP: 118/79 126/75  Pulse: 93 91  Resp: 20 22  Temp:      Last Pain:  Vitals:   07/25/16 0136  TempSrc:   PainSc: 5          Complications: No apparent anesthesia complications

## 2016-07-26 ENCOUNTER — Encounter (HOSPITAL_COMMUNITY): Payer: Self-pay | Admitting: General Surgery

## 2016-07-26 LAB — URINALYSIS, ROUTINE W REFLEX MICROSCOPIC
BILIRUBIN URINE: NEGATIVE
Glucose, UA: NEGATIVE mg/dL
KETONES UR: 20 mg/dL — AB
LEUKOCYTES UA: NEGATIVE
Nitrite: NEGATIVE
PROTEIN: 100 mg/dL — AB
SQUAMOUS EPITHELIAL / LPF: NONE SEEN
Specific Gravity, Urine: 1.039 — ABNORMAL HIGH (ref 1.005–1.030)
pH: 5 (ref 5.0–8.0)

## 2016-07-26 LAB — BASIC METABOLIC PANEL
Anion gap: 8 (ref 5–15)
BUN: 7 mg/dL (ref 6–20)
CHLORIDE: 98 mmol/L — AB (ref 101–111)
CO2: 27 mmol/L (ref 22–32)
Calcium: 8.9 mg/dL (ref 8.9–10.3)
Creatinine, Ser: 1.15 mg/dL (ref 0.61–1.24)
GFR calc non Af Amer: >60 mL/min (ref >60)
Glucose, Bld: 125 mg/dL — ABNORMAL HIGH (ref 65–99)
POTASSIUM: 3.9 mmol/L (ref 3.5–5.1)
SODIUM: 133 mmol/L — AB (ref 135–145)

## 2016-07-26 LAB — CBC
HCT: 41.8 % (ref 39.0–52.0)
Hemoglobin: 14.1 g/dL (ref 13.0–17.0)
MCH: 29.3 pg (ref 26.0–34.0)
MCHC: 33.7 g/dL (ref 30.0–36.0)
MCV: 86.9 fL (ref 78.0–100.0)
Platelets: 218 10*3/uL (ref 150–400)
RBC: 4.81 MIL/uL (ref 4.22–5.81)
RDW: 13 % (ref 11.5–15.5)
WBC: 13.6 10*3/uL — AB (ref 4.0–10.5)

## 2016-07-26 MED ORDER — PANTOPRAZOLE SODIUM 40 MG IV SOLR
40.0000 mg | INTRAVENOUS | Status: DC
  Administered 2016-07-26 – 2016-08-03 (×9): 40 mg via INTRAVENOUS
  Filled 2016-07-26 (×9): qty 40

## 2016-07-26 MED ORDER — ALUM & MAG HYDROXIDE-SIMETH 200-200-20 MG/5ML PO SUSP
30.0000 mL | Freq: Four times a day (QID) | ORAL | Status: DC | PRN
  Administered 2016-07-26 (×2): 30 mL via ORAL
  Filled 2016-07-26: qty 30

## 2016-07-26 NOTE — Progress Notes (Signed)
1 Day Post-Op  Subjective: Still complains of pain but slowly improving  Objective: Vital signs in last 24 hours: Temp:  [98.7 F (37.1 C)-100.6 F (38.1 C)] 100.6 F (38.1 C) (01/21 0405) Pulse Rate:  [92-109] 109 (01/21 0405) Resp:  [18] 18 (01/21 0003) BP: (121-130)/(79-80) 130/79 (01/21 0405) SpO2:  [98 %-100 %] 100 % (01/21 0405) Last BM Date: 07/24/16  Intake/Output from previous day: 01/20 0701 - 01/21 0700 In: 390 [P.O.:340; IV Piggyback:50] Out: 950 [Urine:950] Intake/Output this shift: No intake/output data recorded.  Resp: clear to auscultation bilaterally Cardio: regular rate and rhythm GI: soft, moderate tenderness. incisions ok  Lab Results:   Recent Labs  07/24/16 2259 07/26/16 0535  WBC 17.3* 13.6*  HGB 15.7 14.1  HCT 45.9 41.8  PLT 228 218   BMET  Recent Labs  07/24/16 2259 07/26/16 0535  NA 136 133*  K 3.9 3.9  CL 98* 98*  CO2 25 27  GLUCOSE 112* 125*  BUN 11 7  CREATININE 1.12 1.15  CALCIUM 9.7 8.9   PT/INR No results for input(s): LABPROT, INR in the last 72 hours. ABG No results for input(s): PHART, HCO3 in the last 72 hours.  Invalid input(s): PCO2, PO2  Studies/Results: Ct Abdomen Pelvis W Contrast  Result Date: 07/25/2016 CLINICAL DATA:  Abdominal pain with hypotension EXAM: CT ABDOMEN AND PELVIS WITH CONTRAST TECHNIQUE: Multidetector CT imaging of the abdomen and pelvis was performed using the standard protocol following bolus administration of intravenous contrast. CONTRAST:  100mL ISOVUE-300 IOPAMIDOL (ISOVUE-300) INJECTION 61% COMPARISON:  None. FINDINGS: Lower chest: Lung bases demonstrate no acute consolidation or pleural effusion. Normal heart size. Hepatobiliary: Subcentimeter hypodense lesion in the right hepatic lobe too small to further characterize. Small focal fat infiltration near the falciform ligament. No biliary dilatation. No calcified gallstones. Pancreas: Unremarkable. No pancreatic ductal dilatation or  surrounding inflammatory changes. Spleen: Normal in size without focal abnormality. Adrenals/Urinary Tract: Adrenal glands are unremarkable. Kidneys are normal, without renal calculi, focal lesion, or hydronephrosis. Bladder is unremarkable. Stomach/Bowel: Stomach is nonenlarged. There is no dilated small bowel. Marked inflammatory process in the right lower quadrant. The appendix is enlarged, measuring up to 1.7 cm. There is a 6 mm appendicolith within the proximal lumen of the appendix. No definite extraluminal gas. Thickening of the adjacent distal small bowel, likely reactive. Vascular/Lymphatic: Enlarge right lower quadrant mesenteric lymph nodes. Reproductive: Prostate is unremarkable. Other: Small free fluid in the pelvis.  No free air Musculoskeletal: No acute or significant osseous findings. IMPRESSION: 1. Enlarged appendix measuring up to 17 mm with moderate to marked inflammatory changes in the right lower quadrant, findings would be consistent with appendicitis. There is a 6 mm at appendicolith in the proximal appendiceal lumen. No definite extraluminal gas is seen in the right lower quadrant. 2. Small amount of free fluid in the pelvis Critical Value/emergent results were called by telephone at the time of interpretation on 07/25/2016 at 1:54 am to Dr. Dierdre ForthHANNAH MUTHERSBAUGH , who verbally acknowledged these results. Electronically Signed   By: Jasmine PangKim  Fujinaga M.D.   On: 07/25/2016 01:54    Anti-infectives: Anti-infectives    Start     Dose/Rate Route Frequency Ordered Stop   07/25/16 1000  piperacillin-tazobactam (ZOSYN) IVPB 3.375 g     3.375 g 12.5 mL/hr over 240 Minutes Intravenous Every 8 hours 07/25/16 0600     07/25/16 0215  cefTRIAXone (ROCEPHIN) 2 g in dextrose 5 % 50 mL IVPB     2 g 100 mL/hr  over 30 Minutes Intravenous  Once 07/25/16 0213 07/25/16 0306   07/25/16 0215  piperacillin-tazobactam (ZOSYN) IVPB 3.375 g     3.375 g 12.5 mL/hr over 240 Minutes Intravenous  Once 07/25/16 0213  07/25/16 0645      Assessment/Plan: s/p Procedure(s): APPENDECTOMY LAPAROSCOPIC (N/A) Advance diet  Continue IV zosyn Add protonix for reflux Ambulate POD 1  LOS: 1 day    TOTH III,PAUL S 07/26/2016

## 2016-07-26 NOTE — Plan of Care (Signed)
Problem: Safety: Goal: Ability to remain free from injury will improve Outcome: Progressing No fall or injury noted, safety precautions and fall preventions maintained  Problem: Tissue Perfusion: Goal: Risk factors for ineffective tissue perfusion will decrease Outcome: Progressing No s/s of dvt noted, SCDs are on, patient is on Lovenox  Problem: Activity: Goal: Risk for activity intolerance will decrease Outcome: Progressing Patient ambulated once with assistance in hallway , was up to BR with assistance and tolerated well.  Problem: Bowel/Gastric: Goal: Gastrointestinal status for postoperative course will improve Outcome: Progressing Denies gastric and bowel issues, bowel sounds very hypoactive, burping but not passing flatus  Problem: Physical Regulation: Goal: Postoperative complications will be avoided or minimized Outcome: Progressing No post op complications noted  Problem: Respiratory: Goal: Ability to achieve and maintain a regular respiratory rate will improve Outcome: Progressing Patient is using incentive spirometer 10x an hour while awake and performing CDB exercises, no respiratory distress noted  Problem: Pain Management: Goal: General experience of comfort will improve Outcome: Progressing Resting in the bed with eyes closed at present time, medicated with pain meds. Twice and Robaxin with full relief  Problem: Skin Integrity: Goal: Demonstration of wound healing without infection will improve Outcome: Progressing No skin issues noted, patient has 3 lap. Sites closed with dermabond and steri strips dry and intact

## 2016-07-27 MED ORDER — DEXTROSE-NACL 5-0.45 % IV SOLN: INTRAVENOUS | Status: DC

## 2016-07-27 MED ORDER — BISACODYL 10 MG RE SUPP
10.0000 mg | Freq: Once | RECTAL | Status: AC
  Administered 2016-07-27: 10 mg via RECTAL
  Filled 2016-07-27: qty 1

## 2016-07-27 MED ORDER — DEXTROSE-NACL 5-0.45 % IV SOLN
INTRAVENOUS | Status: DC
  Administered 2016-07-27 – 2016-08-01 (×7): via INTRAVENOUS

## 2016-07-27 MED ORDER — HYDROMORPHONE HCL 2 MG/ML IJ SOLN: 0.5000 mg | INTRAMUSCULAR | Status: DC | PRN

## 2016-07-27 NOTE — Progress Notes (Signed)
Patient ID: Ronnie Jordan, male   DOB: 12/06/1985, [name]89 y.o.   MRN: [id number]   LOS: 2 days   Subjective: Feels fairly lousy, mostly 2/2 nausea. Had many loose BM's after suppository last night. Still some intermittent dysuria. Pain is controllable with tylenol at the moment.   Objective: Vital signs in last 24 hours: Temp:  [99.4 F (37.4 C)-99.7 F (37.6 C)] 99.4 F (37.4 C) (01/22 0520) Pulse Rate:  [97-100] 97 (01/22 0520) BP: (120-130)/(70-73) 130/70 (01/22 0520) SpO2:  [100 %] 100 % (01/22 0520) Last BM Date: 07/26/16   Physical Exam General appearance: alert and no distress Resp: clear to auscultation bilaterally Cardio: regular rate and rhythm GI: normal findings: BS diminished, appropriate TTP, port sites WNL   Assessment/Plan: Acute ruptured appendicitis s/p lap appy -- Zosyn, continue clears Probable UTI -- Continue Zosyn, check culture FEN -- PO intake poor with nausea, will give IVF VTE -- SCD's, Lovenox Dispo -- IV abx    Ronnie CaldronMichael J. Carron Mcmurry, PA-C Pager: [phone] 07/27/2016

## 2016-07-28 LAB — CBC
HEMATOCRIT: 43.1 % (ref 39.0–52.0)
Hemoglobin: 14.4 g/dL (ref 13.0–17.0)
MCH: 29.2 pg (ref 26.0–34.0)
MCHC: 33.4 g/dL (ref 30.0–36.0)
MCV: 87.4 fL (ref 78.0–100.0)
PLATELETS: 249 10*3/uL (ref 150–400)
RBC: 4.93 MIL/uL (ref 4.22–5.81)
RDW: 13 % (ref 11.5–15.5)
WBC: 11.1 10*3/uL — AB (ref 4.0–10.5)

## 2016-07-28 LAB — BASIC METABOLIC PANEL
ANION GAP: 10 (ref 5–15)
BUN: 8 mg/dL (ref 6–20)
CO2: 30 mmol/L (ref 22–32)
Calcium: 9.1 mg/dL (ref 8.9–10.3)
Chloride: 98 mmol/L — ABNORMAL LOW (ref 101–111)
Creatinine, Ser: 1.16 mg/dL (ref 0.61–1.24)
GFR calc Af Amer: >60 mL/min (ref >60)
GFR calc non Af Amer: >60 mL/min (ref >60)
GLUCOSE: 118 mg/dL — AB (ref 65–99)
POTASSIUM: 3.6 mmol/L (ref 3.5–5.1)
Sodium: 138 mmol/L (ref 135–145)

## 2016-07-28 LAB — URINE CULTURE: Culture: NO GROWTH

## 2016-07-28 MED ORDER — METOCLOPRAMIDE HCL 5 MG/ML IJ SOLN
10.0000 mg | Freq: Four times a day (QID) | INTRAMUSCULAR | Status: DC
  Administered 2016-07-28 – 2016-08-04 (×24): 10 mg via INTRAVENOUS
  Filled 2016-07-28 (×24): qty 2

## 2016-07-28 MED ORDER — BACID PO TABS
2.0000 | ORAL_TABLET | Freq: Three times a day (TID) | ORAL | Status: DC
  Administered 2016-07-28 (×3): 2 via ORAL
  Filled 2016-07-28 (×4): qty 2

## 2016-07-28 NOTE — Progress Notes (Signed)
3 Days Post-Op  Subjective: Pt with n/v diarrhea  Objective: Vital signs in last 24 hours: Temp:  [99.5 F (37.5 C)-99.6 F (37.6 C)] 99.5 F (37.5 C) (01/22 2200) Pulse Rate:  [98-100] 98 (01/22 2200) Resp:  [16] 16 (01/22 2200) BP: (125-126)/(81-84) 125/81 (01/22 2200) SpO2:  [99 %] 99 % (01/22 2200) Last BM Date: 07/26/16  Intake/Output from previous day: 01/22 0701 - 01/23 0700 In: 844.6 [P.O.:480; I.V.:314.6; IV Piggyback:50] Out: -  Intake/Output this shift: No intake/output data recorded.  General appearance: alert and cooperative GI: soft, dist, approp ttp, active BS  Lab Results:   Recent Labs  07/26/16 0535 07/28/16 0434  WBC 13.6* 11.1*  HGB 14.1 14.4  HCT 41.8 43.1  PLT 218 249   BMET  Recent Labs  07/26/16 0535 07/28/16 0434  NA 133* 138  K 3.9 3.6  CL 98* 98*  CO2 27 30  GLUCOSE 125* 118*  BUN 7 8  CREATININE 1.15 1.16  CALCIUM 8.9 9.1   Anti-infectives: Anti-infectives    Start     Dose/Rate Route Frequency Ordered Stop   07/25/16 1000  piperacillin-tazobactam (ZOSYN) IVPB 3.375 g     3.375 g 12.5 mL/hr over 240 Minutes Intravenous Every 8 hours 07/25/16 0600     07/25/16 0215  cefTRIAXone (ROCEPHIN) 2 g in dextrose 5 % 50 mL IVPB     2 g 100 mL/hr over 30 Minutes Intravenous  Once 07/25/16 0213 07/25/16 0306   07/25/16 0215  piperacillin-tazobactam (ZOSYN) IVPB 3.375 g     3.375 g 12.5 mL/hr over 240 Minutes Intravenous  Once 07/25/16 [id number] 07/25/16 0645      Assessment/Plan: s/p Procedure(s): APPENDECTOMY LAPAROSCOPIC (N/A) Ileus -add reglan to help with nausea -mobilize    LOS: 3 days    Marigene Ehlersamirez Jr., Jed LimerickArmando 07/28/2016

## 2016-07-29 ENCOUNTER — Inpatient Hospital Stay (HOSPITAL_COMMUNITY): Payer: 59

## 2016-07-29 LAB — C DIFFICILE QUICK SCREEN W PCR REFLEX
C DIFFICILE (CDIFF) INTERP: NOT DETECTED
C Diff antigen: NEGATIVE
C Diff toxin: NEGATIVE

## 2016-07-29 MED ORDER — PHENOL 1.4 % MT LIQD
1.0000 | OROMUCOSAL | Status: DC | PRN
  Filled 2016-07-29: qty 177

## 2016-07-29 NOTE — Progress Notes (Signed)
[facility] Progress Note  4 Days Post-Op  Subjective: Pt having vomiting again this morning. He endorses small numerous green water BM's. He is not having flatus. He is having hiccups. Minimal abdominal pain. He has been ambulating in the halls. No fever or chills overnight.   Objective: Vital signs in last 24 hours: Temp:  [99 F (37.2 C)-99.5 F (37.5 C)] 99 F (37.2 C) (01/24 0530) Pulse Rate:  [78-98] 78 (01/24 0530) Resp:  [18] 18 (01/24 0530) BP: (131-134)/(85-87) 134/87 (01/24 0530) SpO2:  [96 %-99 %] 96 % (01/24 0530) Last BM Date: 07/29/16  Intake/Output from previous day: 01/23 0701 - 01/24 0700 In: 240 [P.O.:240] Out: -  Intake/Output this shift: No intake/output data recorded.  PE: Gen:  Alert, NAD, pleasant, cooperative, sitting up in bed Card:  RRR, no M/G/R heard Pulm:  CTA anteriorly, no W/R/R, rate and effort normal Abd: NG tube in place. Soft, mildly distended, hypoactive bowel sounds, incisions C/D/I Skin: no rashes noted, warm and dry, not diaphoretic  Lab Results:   Recent Labs  07/28/16 0434  WBC 11.1*  HGB 14.4  HCT 43.1  PLT 249   BMET  Recent Labs  07/28/16 0434  NA 138  K 3.6  CL 98*  CO2 30  GLUCOSE 118*  BUN 8  CREATININE 1.16  CALCIUM 9.1   PT/INR No results for input(s): LABPROT, INR in the last 72 hours. CMP     Component Value Date/Time   NA 138 07/28/2016 0434   K 3.6 07/28/2016 0434   CL 98 (L) 07/28/2016 0434   CO2 30 07/28/2016 0434   GLUCOSE 118 (H) 07/28/2016 0434   BUN 8 07/28/2016 0434   CREATININE 1.16 07/28/2016 0434   CALCIUM 9.1 07/28/2016 0434   PROT 7.4 07/24/2016 2259   ALBUMIN 4.6 07/24/2016 2259   AST 17 07/24/2016 2259   ALT 32 07/24/2016 2259   ALKPHOS 124 07/24/2016 2259   BILITOT 0.6 07/24/2016 2259   GFRNONAA >60 07/28/2016 0434   GFRAA >60 07/28/2016 0434   Lipase     Component Value Date/Time   LIPASE 18 07/24/2016 2259       Studies/Results: No results  found.  Anti-infectives: Anti-infectives    Start     Dose/Rate Route Frequency Ordered Stop   07/25/16 1000  piperacillin-tazobactam (ZOSYN) IVPB 3.375 g     3.375 g 12.5 mL/hr over 240 Minutes Intravenous Every 8 hours 07/25/16 0600 07/29/16 1359   07/25/16 0215  cefTRIAXone (ROCEPHIN) 2 g in dextrose 5 % 50 mL IVPB     2 g 100 mL/hr over 30 Minutes Intravenous  Once 07/25/16 0213 07/25/16 0306   07/25/16 0215  piperacillin-tazobactam (ZOSYN) IVPB 3.375 g     3.375 g 12.5 mL/hr over 240 Minutes Intravenous  Once 07/25/16 0213 07/25/16 0645       Assessment/Plan  s/p Procedure(s): APPENDECTOMY LAPAROSCOPIC, Dr. Lindie SpruceWyatt, 07/25/16, POD#4 Ileus - nausea and vomiting, NGT placed, likely ileus - mobilize - WBC trending down, continue zosyn - C. Diff pending with pt reporting diarrhea  FEN: NPO, NGT, IVF VTE: lovenox ID: Zosyn 1/20>>  Plan: NGT, abd film showing dilated loops of small bowel, likely ileus, NPO, encourage ambulation, continue IV reglan and protonix   LOS: 4 days    Jerre SimonJessica L Jaleigh Mccroskey , [facility] 07/29/2016, 7:39 AM Pager: [phone] Consults: [phone] Mon-Fri 7:00 am-4:30 pm Sat-Sun 7:00 am-11:30 am

## 2016-07-30 ENCOUNTER — Inpatient Hospital Stay (HOSPITAL_COMMUNITY): Payer: 59

## 2016-07-30 LAB — CBC
HEMATOCRIT: 40.2 % (ref 39.0–52.0)
Hemoglobin: 13.6 g/dL (ref 13.0–17.0)
MCH: 28.9 pg (ref 26.0–34.0)
MCHC: 33.8 g/dL (ref 30.0–36.0)
MCV: 85.4 fL (ref 78.0–100.0)
PLATELETS: 307 10*3/uL (ref 150–400)
RBC: 4.71 MIL/uL (ref 4.22–5.81)
RDW: 12.9 % (ref 11.5–15.5)
WBC: 9 10*3/uL (ref 4.0–10.5)

## 2016-07-30 MED ORDER — IOPAMIDOL (ISOVUE-300) INJECTION 61%
INTRAVENOUS | Status: AC
  Administered 2016-07-30: 100 mL
  Filled 2016-07-30: qty 100

## 2016-07-30 MED ORDER — IOPAMIDOL (ISOVUE-300) INJECTION 61%
INTRAVENOUS | Status: AC
  Filled 2016-07-30: qty 30

## 2016-07-30 MED ORDER — PIPERACILLIN-TAZOBACTAM 3.375 G IVPB
3.3750 g | Freq: Three times a day (TID) | INTRAVENOUS | Status: DC
  Administered 2016-07-30 – 2016-08-05 (×18): 3.375 g via INTRAVENOUS
  Filled 2016-07-30 (×19): qty 50

## 2016-07-30 NOTE — Care Management (Signed)
Case manager following for any needs. Nolon NationsSuasn Nathanal Hermiz, RN BSN Case Manager

## 2016-07-30 NOTE — Progress Notes (Signed)
Called report to [phone]

## 2016-07-30 NOTE — Progress Notes (Signed)
[facility] Progress Note  5 Days Post-Op  Subjective: Reports issues with NGT overnight requiring replacement between 1-3 AM. Feels more abdominal discomfort and distention this morning compared to yesterday. Denies flatus. Having liquid bowel movements. Not requiring pain medication. Ambulating.  Objective: Vital signs in last 24 hours: Temp:  [98.4 F (36.9 C)-99.4 F (37.4 C)] 98.9 F (37.2 C) (01/25 0449) Pulse Rate:  [75-83] 83 (01/25 0449) Resp:  [18] 18 (01/25 0449) BP: (124-138)/(79-89) 124/81 (01/25 0449) SpO2:  [99 %-100 %] 99 % (01/25 0449) Last BM Date: 07/30/16  Intake/Output from previous day: 01/24 0701 - 01/25 0700 In: 0  Out: 850 [Emesis/NG output:850] Intake/Output this shift: No intake/output data recorded.  PE: Gen:  Alert, uncomfortable, cooperative Pulm:  CTA, no W/R/R Abd: Soft, appropriately tender without peritonitis or guarding, hypoactive BS, incisions C/D/I  Ext:  No erythema, edema, or tenderness   Lab Results:   Recent Labs  07/28/16 0434  WBC 11.1*  HGB 14.4  HCT 43.1  PLT 249   BMET  Recent Labs  07/28/16 0434  NA 138  K 3.6  CL 98*  CO2 30  GLUCOSE 118*  BUN 8  CREATININE 1.16  CALCIUM 9.1   PT/INR No results for input(s): LABPROT, INR in the last 72 hours. CMP     Component Value Date/Time   NA 138 07/28/2016 0434   K 3.6 07/28/2016 0434   CL 98 (L) 07/28/2016 0434   CO2 30 07/28/2016 0434   GLUCOSE 118 (H) 07/28/2016 0434   BUN 8 07/28/2016 0434   CREATININE 1.16 07/28/2016 0434   CALCIUM 9.1 07/28/2016 0434   PROT 7.4 07/24/2016 2259   ALBUMIN 4.6 07/24/2016 2259   AST 17 07/24/2016 2259   ALT 32 07/24/2016 2259   ALKPHOS 124 07/24/2016 2259   BILITOT 0.6 07/24/2016 2259   GFRNONAA >60 07/28/2016 0434   GFRAA >60 07/28/2016 0434   Lipase     Component Value Date/Time   LIPASE 18 07/24/2016 2259       Studies/Results: Dg Abd Portable 1v  Result Date: 07/30/2016 CLINICAL DATA:   Follow-up examination for NG tube placement. EXAM: PORTABLE ABDOMEN - 1 VIEW COMPARISON:  Prior radiograph from earlier the same day. FINDINGS: An enteric tube is now visualized, seen coiled back on itself within the gastric fundus, with tip projecting over the distal stomach. Tip projects distally. Side hole well beyond the GE junction. Scattered mildly prominent gas-filled loops of bowel within the mid abdomen are not significantly changed. IMPRESSION: Enteric tube in place with tip overlying the distal stomach, side hole in the gastric body. Tip projects distally. Electronically Signed   By: Rise MuBenjamin  McClintock M.D.   On: 07/30/2016 03:31   Dg Abd Portable 1v  Addendum Date: 07/30/2016   ADDENDUM REPORT: 07/30/2016 03:02 ADDENDUM: Upon further review, no definite NG tube is visualized on the image provided. There is a linear somewhat opaque tubular density along the left lateral aspect of the spine, which may reflect a enteric catheter, although this is not entirely certain. Query malpositioning within the esophagus or thorax. Repeat imaging including the lower chest is recommended. Electronically Signed   By: Rise MuBenjamin  McClintock M.D.   On: 07/30/2016 03:02   Result Date: 07/30/2016 CLINICAL DATA:  Initial evaluation for nausea and vomiting. EXAM: PORTABLE ABDOMEN - 1 VIEW COMPARISON:  Prior radiograph from 07/29/2016. FINDINGS: There are persisting gas-filled dilated loops of small bowel clustered within the mid and upper abdomen. These measure up  to 5.4 cm. Paucity of gas distally. Findings most suggestive of possible obstruction. No free air on this limited supine view the abdomen. No soft tissue mass or abnormal calcification. Fossa structures within normal limits. IMPRESSION: Persistent dilated gas-filled loops of small bowel within the mid and upper abdomen with a paucity of gas distally. Findings most suggestive of possible obstruction. Electronically Signed: By: Rise Mu M.D. On:  07/30/2016 02:21   Dg Abd Portable 1v  Result Date: 07/29/2016 CLINICAL DATA:  Vomiting.  Nasogastric tube placement. EXAM: PORTABLE ABDOMEN - 1 VIEW COMPARISON:  None. FINDINGS: Mildly dilated small bowel loops are noted suggesting ileus or distal small bowel obstruction. No definite colonic dilatation is noted. Distal tip of nasogastric tube is seen in proximal stomach. No radio-opaque calculi or other significant radiographic abnormality are seen. IMPRESSION: Mildly dilated small bowel loops are noted suggesting ileus or distal small bowel obstruction. Distal tip of nasogastric tube is seen in proximal stomach. Electronically Signed   By: Lupita Raider, M.D.   On: 07/29/2016 08:47    Anti-infectives: Anti-infectives    Start     Dose/Rate Route Frequency Ordered Stop   07/25/16 1000  piperacillin-tazobactam (ZOSYN) IVPB 3.375 g     3.375 g 12.5 mL/hr over 240 Minutes Intravenous Every 8 hours 07/25/16 0600 07/29/16 1031   07/25/16 0215  cefTRIAXone (ROCEPHIN) 2 g in dextrose 5 % 50 mL IVPB     2 g 100 mL/hr over 30 Minutes Intravenous  Once 07/25/16 0213 07/25/16 0306   07/25/16 0215  piperacillin-tazobactam (ZOSYN) IVPB 3.375 g     3.375 g 12.5 mL/hr over 240 Minutes Intravenous  Once 07/25/16 0213 07/25/16 0645     Assessment/Plan Ruptured appendicitis POD#5 S/P APPENDECTOMY LAPAROSCOPIC, Dr. Lindie Spruce, 07/25/16 - post-operative ileus - NGT to LOW INTERMITTENT WALL SUCTION - replaced overnight, output 1300cc/24 h, bilious - diarrhea - c.dif negative - ambulate/IS  FEN: NPO, IVF ID: Zosyn 1/20 - 1/24  VTE:  Plan: prolonged post-operative ileus, check CBC and Abdominal CT scan     LOS: 5 days    Adam Phenix , [facility] 07/30/2016, 9:10 AM Pager: [phone] Consults: [phone] Mon-Fri 7:00 am-4:30 pm Sat-Sun 7:00 am-11:30 am

## 2016-07-30 NOTE — Progress Notes (Signed)
RN asked for a second opinion on portable abd xr due to not able to see NGT on film and no report of proper replacement in chart s/p NGT placement. I spoke with Dr. Phill MyronMcClintock and he requested to repeat a xr including abd and chest. KUB ordered and completed, assisted with NGT advancement.

## 2016-07-31 NOTE — Progress Notes (Signed)
[facility] Progress Note  6 Days Post-Op  Subjective: Less abdominal discomfort compared to yesterday. Denies flatus. Having multiple loose stools daily. 1800 cc/24h from NGT.  Objective: Vital signs in last 24 hours: Temp:  [98.6 F (37 C)-99.1 F (37.3 C)] 98.9 F (37.2 C) (01/26 0500) Pulse Rate:  [76-81] 76 (01/26 0500) Resp:  [18] 18 (01/26 0500) BP: (127-139)/(79-83) 132/83 (01/26 0500) SpO2:  [94 %-100 %] 99 % (01/26 0500) Last BM Date: 07/30/16  Intake/Output from previous day: 01/25 0701 - 01/26 0700 In: 1980 [P.O.:480; I.V.:1450; IV Piggyback:50] Out: 1800 [Emesis/NG output:1800] Intake/Output this shift: No intake/output data recorded.  PE: Gen:  Alert, uncomfortable, cooperative Pulm:  CTA, no W/R/R Abd: Soft, appropriately tender without peritonitis or guarding, +BS, incisions C/D/I  Ext:  No erythema, edema, or tenderness   Lab Results:   Recent Labs  07/30/16 1109  WBC 9.0  HGB 13.6  HCT 40.2  PLT 307   BMET No results for input(s): NA, K, CL, CO2, GLUCOSE, BUN, CREATININE, CALCIUM in the last 72 hours. PT/INR No results for input(s): LABPROT, INR in the last 72 hours. CMP     Component Value Date/Time   NA 138 07/28/2016 0434   K 3.6 07/28/2016 0434   CL 98 (L) 07/28/2016 0434   CO2 30 07/28/2016 0434   GLUCOSE 118 (H) 07/28/2016 0434   BUN 8 07/28/2016 0434   CREATININE 1.16 07/28/2016 0434   CALCIUM 9.1 07/28/2016 0434   PROT 7.4 07/24/2016 2259   ALBUMIN 4.6 07/24/2016 2259   AST 17 07/24/2016 2259   ALT 32 07/24/2016 2259   ALKPHOS 124 07/24/2016 2259   BILITOT 0.6 07/24/2016 2259   GFRNONAA >60 07/28/2016 0434   GFRAA >60 07/28/2016 0434   Lipase     Component Value Date/Time   LIPASE 18 07/24/2016 2259       Studies/Results: Ct Abdomen Pelvis W Contrast  Result Date: 07/30/2016 CLINICAL DATA:  Leukocytosis following an appendectomy. Diarrhea, nausea and vomiting. EXAM: CT ABDOMEN AND PELVIS WITH CONTRAST  TECHNIQUE: Multidetector CT imaging of the abdomen and pelvis was performed using the standard protocol following bolus administration of intravenous contrast. CONTRAST:  ISOVUE-300 IOPAMIDOL (ISOVUE-300) INJECTION 61% COMPARISON:  Abdomen radiograph obtained earlier today. Abdomen and pelvis CT dated 07/25/2016. FINDINGS: Lower chest: Mild bibasilar atelectasis and minimal bilateral pleural fluid. Hepatobiliary: Stable small cyst and additional small cyst or hemangioma in the liver. Unremarkable gallbladder. Pancreas: Unremarkable. No pancreatic ductal dilatation or surrounding inflammatory changes. Spleen: Normal in size without focal abnormality. Adrenals/Urinary Tract: Normal appearing adrenal glands. Small bilateral renal cysts. Normal appearing urinary bladder and ureters. No calculi. Stomach/Bowel: Interval post appendectomy changes. Multiple moderately to markedly dilated small bowel loops with transition to normal caliber distal small bowel in the right lower abdomen, best seen on coronal image number 57. The distal small bowel exhibits moderate diffuse wall thickening and enhancement. There is a minimal amount of free peritoneal fluid. There is also a small collection of fluid with thin peripheral rim enhancement in the right pelvis, measuring 3.6 x 1.2 cm on coronal image number 42 and 2.6 cm in AP diameter on sagittal image number 40. There is a second similar-appearing fluid collection in the right pelvis on axial image number 72 of series 2, measuring 2.1 x 1.2 cm on that image and 2.8 cm in length on sagittal image number 32. Nasogastric tube tip in the second portion of the duodenum. Normal caliber colon. Vascular/Lymphatic: No significant vascular  findings are present. No enlarged abdominal or pelvic lymph nodes. Reproductive: Minimally enlarged prostate gland containing coarse calcification. Other: None. Musculoskeletal: Bilateral pelvic and femoral bone islands. Mild posterior disc  protrusion and calcification at the L5-S1 level. IMPRESSION: 1. Status post appendectomy with interval distal small bowel inflammatory changes, causing partial small bowel obstruction. 2. Small probable developing abscesses in the right mid and upper pelvis, one measuring 3.6 x 2.6 x 1.2 cm and the other measuring 2.8 x 2.1 x 1.2 cm. Electronically Signed   By: Beckie Salts M.D.   On: 07/30/2016 14:39   Dg Abd Portable 1v  Result Date: 07/30/2016 CLINICAL DATA:  Follow-up examination for NG tube placement. EXAM: PORTABLE ABDOMEN - 1 VIEW COMPARISON:  Prior radiograph from earlier the same day. FINDINGS: An enteric tube is now visualized, seen coiled back on itself within the gastric fundus, with tip projecting over the distal stomach. Tip projects distally. Side hole well beyond the GE junction. Scattered mildly prominent gas-filled loops of bowel within the mid abdomen are not significantly changed. IMPRESSION: Enteric tube in place with tip overlying the distal stomach, side hole in the gastric body. Tip projects distally. Electronically Signed   By: Rise Mu M.D.   On: 07/30/2016 03:31   Dg Abd Portable 1v  Addendum Date: 07/30/2016   ADDENDUM REPORT: 07/30/2016 03:02 ADDENDUM: Upon further review, no definite NG tube is visualized on the image provided. There is a linear somewhat opaque tubular density along the left lateral aspect of the spine, which may reflect a enteric catheter, although this is not entirely certain. Query malpositioning within the esophagus or thorax. Repeat imaging including the lower chest is recommended. Electronically Signed   By: Rise Mu M.D.   On: 07/30/2016 03:02   Result Date: 07/30/2016 CLINICAL DATA:  Initial evaluation for nausea and vomiting. EXAM: PORTABLE ABDOMEN - 1 VIEW COMPARISON:  Prior radiograph from 07/29/2016. FINDINGS: There are persisting gas-filled dilated loops of small bowel clustered within the mid and upper abdomen. These  measure up to 5.4 cm. Paucity of gas distally. Findings most suggestive of possible obstruction. No free air on this limited supine view the abdomen. No soft tissue mass or abnormal calcification. Fossa structures within normal limits. IMPRESSION: Persistent dilated gas-filled loops of small bowel within the mid and upper abdomen with a paucity of gas distally. Findings most suggestive of possible obstruction. Electronically Signed: By: Rise Mu M.D. On: 07/30/2016 02:21    Anti-infectives: Anti-infectives    Start     Dose/Rate Route Frequency Ordered Stop   07/30/16 1600  piperacillin-tazobactam (ZOSYN) IVPB 3.375 g     3.375 g 12.5 mL/hr over 240 Minutes Intravenous Every 8 hours 07/30/16 1559     07/25/16 1000  piperacillin-tazobactam (ZOSYN) IVPB 3.375 g     3.375 g 12.5 mL/hr over 240 Minutes Intravenous Every 8 hours 07/25/16 0600 07/29/16 1031   07/25/16 0215  cefTRIAXone (ROCEPHIN) 2 g in dextrose 5 % 50 mL IVPB     2 g 100 mL/hr over 30 Minutes Intravenous  Once 07/25/16 0213 07/25/16 0306   07/25/16 0215  piperacillin-tazobactam (ZOSYN) IVPB 3.375 g     3.375 g 12.5 mL/hr over 240 Minutes Intravenous  Once 07/25/16 0213 07/25/16 0645       Assessment/Plan Ruptured appendicitis POD#6 S/P APPENDECTOMY LAPAROSCOPIC, Dr. Lindie Spruce, 07/25/16 - post-operative ileus - repeat CT abdomen/pelvis 1/25: Small probable developing abscesses in the right mid and upper pelvis, one measuring 3.6 x 2.6 x 1.2  cm and the other measuring 2.8x 2.1 x 1.2 cm. - NGT: 1800 cc/24 h - ambulate/IS  Leukocytosis - resolved (9.) Diarrhea: C - dif negative  FEN: NPO, IVF ID: Zosyn 1/20 - 1/24, stopped for 24 h re-started 1/24 >> VTE: Lovenox SCD's  Plan: prolonged post-operative ileus; continue NGT to LIWS, bowel rest, and IV antibiotics Ambulate   LOS: 6 days    Adam PhenixElizabeth S Simaan , [facility] 07/31/2016, 9:33 AM Pager: [phone] Consults: [phone] Mon-Fri  7:00 am-4:30 pm Sat-Sun 7:00 am-11:30 am

## 2016-08-01 LAB — BASIC METABOLIC PANEL
Anion gap: 12 (ref 5–15)
BUN: 5 mg/dL — ABNORMAL LOW (ref 6–20)
CHLORIDE: 99 mmol/L — AB (ref 101–111)
CO2: 26 mmol/L (ref 22–32)
Calcium: 9.1 mg/dL (ref 8.9–10.3)
Creatinine, Ser: 1.1 mg/dL (ref 0.61–1.24)
GFR calc Af Amer: >60 mL/min (ref >60)
GFR calc non Af Amer: >60 mL/min (ref >60)
GLUCOSE: 121 mg/dL — AB (ref 65–99)
POTASSIUM: 3.5 mmol/L (ref 3.5–5.1)
SODIUM: 137 mmol/L (ref 135–145)

## 2016-08-01 LAB — CBC
HEMATOCRIT: 40.5 % (ref 39.0–52.0)
Hemoglobin: 13.7 g/dL (ref 13.0–17.0)
MCH: 28.7 pg (ref 26.0–34.0)
MCHC: 33.8 g/dL (ref 30.0–36.0)
MCV: 84.7 fL (ref 78.0–100.0)
Platelets: 300 10*3/uL (ref 150–400)
RBC: 4.78 MIL/uL (ref 4.22–5.81)
RDW: 12.6 % (ref 11.5–15.5)
WBC: 11.3 10*3/uL — AB (ref 4.0–10.5)

## 2016-08-01 MED ORDER — POTASSIUM CHLORIDE 2 MEQ/ML IV SOLN
INTRAVENOUS | Status: DC
  Administered 2016-08-01 – 2016-08-03 (×7): via INTRAVENOUS
  Filled 2016-08-01 (×12): qty 1000

## 2016-08-01 NOTE — Progress Notes (Signed)
7 Days Post-Op  Subjective: Says he has had several stools.  States he wants to go home.  Denies nausea. Afebrile.  Stable.  Heart rate 87.  Good urine output. NG output 1000 mL per 24 hours but only 200 mL last 12 hours.  Hemoglobin 13.7.  WBC 11,300.  Objective: Vital signs in last 24 hours: Temp:  [97.8 F (36.6 C)-98.9 F (37.2 C)] 98.8 F (37.1 C) (01/27 4239) Pulse Rate:  [74-96] 87 (01/27 0632) Resp:  [18] 18 (01/27 5320) BP: (124-135)/(88-92) 135/88 (01/27 2334) SpO2:  [99 %-100 %] 99 % (01/27 3568) Last BM Date: 07/31/16  Intake/Output from previous day: 01/26 0701 - 01/27 0700 In: 3072.9 [I.V.:2922.9; IV Piggyback:150] Out: 2700 [Urine:1700; Emesis/NG output:1000] Intake/Output this shift: Total I/O In: 0  Out: 350 [Urine:350]  General appearance: Alert.  Appears comfortable.  Mental status normal.  Cooperative. Resp: clear to auscultation bilaterally GI: Soft.  Not really distended.  Few bowel sounds.  Wounds look fine.  Lab Results:  Results for orders placed or performed during the hospital encounter of 07/24/16 (from the past 24 hour(s))  CBC     Status: Abnormal   Collection Time: 08/01/16  7:29 AM  Result Value Ref Range   WBC 11.3 (H) 4.0 - 10.5 K/uL   RBC 4.78 4.22 - 5.81 MIL/uL   Hemoglobin 13.7 13.0 - 17.0 g/dL   HCT 40.5 39.0 - 52.0 %   MCV 84.7 78.0 - 100.0 fL   MCH 28.7 26.0 - 34.0 pg   MCHC 33.8 30.0 - 36.0 g/dL   RDW 12.6 11.5 - 15.5 %   Platelets 300 150 - 400 K/uL     Studies/Results: No results found.  Marland Kitchen acetaminophen  1,000 mg Oral Q6H  . atorvastatin  20 mg Oral Daily  . enoxaparin (LOVENOX) injection  40 mg Subcutaneous Q24H  . metoCLOPramide (REGLAN) injection  10 mg Intravenous Q6H  . pantoprazole (PROTONIX) IV  40 mg Intravenous Q24H  . piperacillin-tazobactam (ZOSYN)  IV  3.375 g Intravenous Q8H     Assessment/Plan: s/p Procedure(s): APPENDECTOMY LAPAROSCOPIC   Ruptured appendicitis POD#7 S/P APPENDECTOMY  LAPAROSCOPIC, Dr. Hulen Skains, 07/25/16 - post-operative ileus - repeat CT abdomen/pelvis 1/25: Small probable developing abscesses in the right mid and upper pelvis, one measuring 3.6 x 2.6 x 1.2 cm and the other measuring 2.8x 2.1 x 1.2 cm. -NG tube drainage less.  Discontinue NG tube today.  Start clear liquids  Leukocytosis - 11.3 today.  (9.) Diarrhea: C - dif negative  FEN: NPO, IVF ID: Zosyn 1/20 - 1/24, stopped for 24 h re-started 1/24 >> VTE: Lovenox SCD's  Plan: prolonged post-operative ileus; DC NG tube and start clear liquids  Continue IV antibiotics Ambulate IV fluids adjusted. Check b-met  @PROBHOSP @  LOS: 7 days    Sharron Simpson M 08/01/2016  . .prob

## 2016-08-02 MED ORDER — BISACODYL 10 MG RE SUPP
10.0000 mg | Freq: Two times a day (BID) | RECTAL | Status: AC
  Administered 2016-08-02 (×2): 10 mg via RECTAL
  Filled 2016-08-02 (×2): qty 1

## 2016-08-02 NOTE — Progress Notes (Signed)
8 Days Post-Op  Subjective: NG removed yesterday. Feels a little bloated and crampy.  No vomiting 3 small stools but minimal flatus Afebrile.  Heart rate 86. Potassium 3.5.  Now on lactated Ringer's and 20 KCl per liter.  Objective: Vital signs in last 24 hours: Temp:  [98.4 F (36.9 C)-98.8 F (37.1 C)] 98.7 F (37.1 C) (01/28 0547) Pulse Rate:  [75-87] 86 (01/28 0547) Resp:  [17-18] 17 (01/28 0547) BP: (121-146)/(82-89) 127/83 (01/28 0547) SpO2:  [95 %-100 %] 95 % (01/28 0547) Last BM Date: 07/31/16  Intake/Output from previous day: 01/27 0701 - 01/28 0700 In: 3506.7 [P.O.:1300; I.V.:1956.7; IV Piggyback:250] Out: 454 [Urine:451; Stool:3] Intake/Output this shift: No intake/output data recorded.  General appearance: morbidly obese and Alert.  Slightly anxious but in no physical distress.  Mental status normal Resp: clear to auscultation bilaterally GI: Abdomen soft.  Bowel sounds present.  Borderline distended.  Borderline tympanitic.  No localized tenderness.  Wounds look fine.  Lab Results:  Results for orders placed or performed during the hospital encounter of 07/24/16 (from the past 24 hour(s))  Basic metabolic panel     Status: Abnormal   Collection Time: 08/01/16 10:01 AM  Result Value Ref Range   Sodium 137 135 - 145 mmol/L   Potassium 3.5 3.5 - 5.1 mmol/L   Chloride 99 (L) 101 - 111 mmol/L   CO2 26 22 - 32 mmol/L   Glucose, Bld 121 (H) 65 - 99 mg/dL   BUN 5 (L) 6 - 20 mg/dL   Creatinine, Ser 4.541.10 0.61 - 1.24 mg/dL   Calcium 9.1 8.9 - 09.810.3 mg/dL   GFR calc non Af Amer >60 >60 mL/min   GFR calc Af Amer >60 >60 mL/min   Anion gap 12 5 - 15     Studies/Results: No results found.  Marland Kitchen. acetaminophen  1,000 mg Oral Q6H  . atorvastatin  20 mg Oral Daily  . bisacodyl  10 mg Rectal BID  . enoxaparin (LOVENOX) injection  40 mg Subcutaneous Q24H  . metoCLOPramide (REGLAN) injection  10 mg Intravenous Q6H  . pantoprazole (PROTONIX) IV  40 mg Intravenous Q24H  .  piperacillin-tazobactam (ZOSYN)  IV  3.375 g Intravenous Q8H     Assessment/Plan: s/p Procedure(s): APPENDECTOMY LAPAROSCOPIC   Ruptured appendicitis POD#8S/P APPENDECTOMY LAPAROSCOPIC, Dr. Lindie SpruceWyatt, 07/25/16 - post-operative ileus - repeat CT abdomen/pelvis 1/25: Small probable developing abscesses in the right mid and upper pelvis, one measuring 3.6 x 2.6 x 1.2 cm and the other measuring 2.8x 2.1 x 1.2 cm. -NG tube drainage less.  Discontinue NG tube today.  Start clear liquids  Leukocytosis - 11.3 08/02/2015.  (9.) Diarrhea: C - dif negative  FEN: CL, IVF ID: Zosyn 1/20 - 1/24, stopped for 24 h re-started 1/24 >> VTE: Lovenox SCD's  Plan: prolonged post-operative ileus; DC NG tube(done 1/27)   .  I told him to back off on the clear liquids Try Dulcolax  Suppository Repeat lab work tomorrow Consider repeat CT scan to rule out abscess Continue IV antibiotics   @PROBHOSP @  LOS: 8 days    Ronnie Jordan M 08/02/2016  . .prob

## 2016-08-03 LAB — BASIC METABOLIC PANEL
ANION GAP: 9 (ref 5–15)
BUN: 8 mg/dL (ref 6–20)
CO2: 23 mmol/L (ref 22–32)
Calcium: 8.6 mg/dL — ABNORMAL LOW (ref 8.9–10.3)
Chloride: 105 mmol/L (ref 101–111)
Creatinine, Ser: 1.12 mg/dL (ref 0.61–1.24)
GFR calc Af Amer: >60 mL/min (ref >60)
GFR calc non Af Amer: >60 mL/min (ref >60)
GLUCOSE: 85 mg/dL (ref 65–99)
POTASSIUM: 3.7 mmol/L (ref 3.5–5.1)
Sodium: 137 mmol/L (ref 135–145)

## 2016-08-03 LAB — CBC
HEMATOCRIT: 37.8 % — AB (ref 39.0–52.0)
HEMOGLOBIN: 12.6 g/dL — AB (ref 13.0–17.0)
MCH: 28.3 pg (ref 26.0–34.0)
MCHC: 33.3 g/dL (ref 30.0–36.0)
MCV: 84.9 fL (ref 78.0–100.0)
Platelets: 270 10*3/uL (ref 150–400)
RBC: 4.45 MIL/uL (ref 4.22–5.81)
RDW: 12.4 % (ref 11.5–15.5)
WBC: 9.3 10*3/uL (ref 4.0–10.5)

## 2016-08-03 MED ORDER — SACCHAROMYCES BOULARDII 250 MG PO CAPS
250.0000 mg | ORAL_CAPSULE | Freq: Two times a day (BID) | ORAL | Status: DC
  Administered 2016-08-03 – 2016-08-05 (×5): 250 mg via ORAL
  Filled 2016-08-03 (×5): qty 1

## 2016-08-03 NOTE — Progress Notes (Signed)
9 Days Post-Op  Subjective: Multiple stools. Feels a lot better today still with some minor bloating. Had some clears yesterday/this am; ambulating  Objective: Vital signs in last 24 hours: Temp:  [98.2 F (36.8 C)-98.4 F (36.9 C)] 98.2 F (36.8 C) (01/29 0519) Pulse Rate:  [64-67] 64 (01/29 0519) Resp:  [17-18] 17 (01/29 0519) BP: (123-140)/(75-86) 132/86 (01/29 0519) SpO2:  [100 %] 100 % (01/29 0519) Last BM Date: 08/02/16  Intake/Output from previous day: 01/28 0701 - 01/29 0700 In: 2915 [P.O.:460; I.V.:2305; IV Piggyback:150] Out: 850 [Urine:350; Emesis/NG output:500] Intake/Output this shift: No intake/output data recorded.  Alert, not ill appearing cta b/l Reg Soft, very mild distension, nt, +BS No edema  Lab Results:   Recent Labs  08/01/16 0729 08/03/16 0517  WBC 11.3* 9.3  HGB 13.7 12.6*  HCT 40.5 37.8*  PLT 300 270   BMET  Recent Labs  08/01/16 1001 08/03/16 0517  NA 137 137  K 3.5 3.7  CL 99* 105  CO2 26 23  GLUCOSE 121* 85  BUN 5* 8  CREATININE 1.10 1.12  CALCIUM 9.1 8.6*   PT/INR No results for input(s): LABPROT, INR in the last 72 hours. ABG No results for input(s): PHART, HCO3 in the last 72 hours.  Invalid input(s): PCO2, PO2  Studies/Results: No results found.  Anti-infectives: Anti-infectives    Start     Dose/Rate Route Frequency Ordered Stop   07/30/16 1600  piperacillin-tazobactam (ZOSYN) IVPB 3.375 g     3.375 g 12.5 mL/hr over 240 Minutes Intravenous Every 8 hours 07/30/16 1559     07/25/16 1000  piperacillin-tazobactam (ZOSYN) IVPB 3.375 g     3.375 g 12.5 mL/hr over 240 Minutes Intravenous Every 8 hours 07/25/16 0600 07/29/16 1031   07/25/16 0215  cefTRIAXone (ROCEPHIN) 2 g in dextrose 5 % 50 mL IVPB     2 g 100 mL/hr over 30 Minutes Intravenous  Once 07/25/16 0213 07/25/16 0306   07/25/16 0215  piperacillin-tazobactam (ZOSYN) IVPB 3.375 g     3.375 g 12.5 mL/hr over 240 Minutes Intravenous  Once 07/25/16 0213  07/25/16 0645      Assessment/Plan: s/p Procedure(s): APPENDECTOMY LAPAROSCOPIC (N/A) POD#9S/P APPENDECTOMY LAPAROSCOPIC, Dr. Lindie SpruceWyatt, 07/25/16 - post-operative ileus - repeat CT abdomen/pelvis 1/25: Small probable developing abscesses in the right mid and upper pelvis, one measuring 3.6 x 2.6 x 1.2 cm and the other measuring 2.8x 2.1 x 1.2 cm. -NG tube out 1/28 - ileus improving. Cont clears today. If does well with clears for bkfast and lunch will adv to fulls for dinner  Leukocytosis - 11.3 08/02/2015. down to 9 today Diarrhea: C - dif negative  FEN: CL, IVF ID: Zosyn 1/20 - 1/24, stopped for 24 h re-started 1/24 >> VTE: Lovenox SCD's  Mary SellaEric Jordan. Andrey CampanileWilson, Ronnie Jordan, Ronnie Jordan [facility], [place]   LOS: 9 days    Ronnie Jordan 08/03/2016

## 2016-08-04 DIAGNOSIS — K567 Ileus, unspecified: Secondary | ICD-10-CM | POA: Diagnosis not present

## 2016-08-04 DIAGNOSIS — K9189 Other postprocedural complications and disorders of digestive system: Secondary | ICD-10-CM

## 2016-08-04 LAB — COMPREHENSIVE METABOLIC PANEL
ALK PHOS: 103 U/L (ref 38–126)
ALT: 87 U/L — AB (ref 17–63)
AST: 33 U/L (ref 15–41)
Albumin: 3.3 g/dL — ABNORMAL LOW (ref 3.5–5.0)
Anion gap: 14 (ref 5–15)
BUN: <5 mg/dL — ABNORMAL LOW (ref 6–20)
CALCIUM: 9 mg/dL (ref 8.9–10.3)
CO2: 23 mmol/L (ref 22–32)
CREATININE: 1.2 mg/dL (ref 0.61–1.24)
Chloride: 100 mmol/L — ABNORMAL LOW (ref 101–111)
GFR calc Af Amer: >60 mL/min (ref >60)
GFR calc non Af Amer: >60 mL/min (ref >60)
GLUCOSE: 88 mg/dL (ref 65–99)
Potassium: 4 mmol/L (ref 3.5–5.1)
SODIUM: 137 mmol/L (ref 135–145)
Total Bilirubin: 1 mg/dL (ref 0.3–1.2)
Total Protein: 6.6 g/dL (ref 6.5–8.1)

## 2016-08-04 MED ORDER — METOCLOPRAMIDE HCL 5 MG PO TABS
5.0000 mg | ORAL_TABLET | Freq: Three times a day (TID) | ORAL | Status: DC
  Administered 2016-08-04 – 2016-08-05 (×3): 5 mg via ORAL
  Filled 2016-08-04 (×3): qty 1

## 2016-08-04 MED ORDER — FAMOTIDINE 20 MG PO TABS
20.0000 mg | ORAL_TABLET | Freq: Every day | ORAL | Status: DC
  Administered 2016-08-04 – 2016-08-05 (×2): 20 mg via ORAL
  Filled 2016-08-04 (×2): qty 1

## 2016-08-04 MED ORDER — ACETAMINOPHEN 500 MG PO TABS
1000.0000 mg | ORAL_TABLET | Freq: Four times a day (QID) | ORAL | Status: DC | PRN
  Administered 2016-08-04 – 2016-08-05 (×2): 1000 mg via ORAL
  Filled 2016-08-04 (×2): qty 2

## 2016-08-04 MED ORDER — FAMOTIDINE 20 MG PO TABS: 20.0000 mg | ORAL_TABLET | Freq: Two times a day (BID) | ORAL | Status: DC

## 2016-08-04 NOTE — Progress Notes (Signed)
10 Days Post-Op  Subjective: He is doing really well. Walking all over he's tolerating the full liquids. Not recorded but he is having some loose stools he's had 2 since yesterday. Abdomen is soft not overly distended. He is not tender.  Objective: Vital signs in last 24 hours: Temp:  [97.6 F (36.4 C)-98 F (36.7 C)] 98 F (36.7 C) (01/30 0537) Pulse Rate:  [62-94] 62 (01/30 0537) Resp:  [17-18] 17 (01/30 0537) BP: (127-137)/(79) 129/79 (01/30 0537) SpO2:  [99 %-100 %] 99 % (01/30 0537) Last BM Date: 08/04/16 480 PO Urine 650 recorded Afebrile, VSS Labs OK  CT scan 07/30/16:  1. Status post appendectomy with interval distal small bowel inflammatory changes, causing partial small bowel obstruction. 2. Small probable developing abscesses in the right mid and upper pelvis, one measuring 3.6 x 2.6 x 1.2 cm and the other measuring 2.8 x 2.1 x 1.2 cm. Intake/Output from previous day: 01/29 0701 - 01/30 0700 In: 480 [P.O.:480] Out: 650 [Urine:650] Intake/Output this shift: Total I/O In: 120 [P.O.:120] Out: 250 [Urine:250]  General appearance: alert, cooperative and no distress Resp: clear to auscultation bilaterally GI: Soft, sore. Not distended positive bowel sounds having some loose stools. Port sites all look fine.  Lab Results:   Recent Labs  08/03/16 0517  WBC 9.3  HGB 12.6*  HCT 37.8*  PLT 270    BMET  Recent Labs  08/03/16 0517  NA 137  K 3.7  CL 105  CO2 23  GLUCOSE 85  BUN 8  CREATININE 1.12  CALCIUM 8.6*   PT/INR No results for input(s): LABPROT, INR in the last 72 hours.  No results for input(s): AST, ALT, ALKPHOS, BILITOT, PROT, ALBUMIN in the last 168 hours.   Lipase     Component Value Date/Time   LIPASE 18 07/24/2016 2259     Studies/Results: No results found. Prior to Admission medications   Medication Sig Start Date End Date Taking? Authorizing Provider  amphetamine-dextroamphetamine (ADDERALL XR) 10 MG 24 hr capsule Take 10 mg by  mouth daily as needed (ADHD).   Yes Historical Provider, MD  atorvastatin (LIPITOR) 20 MG tablet Take 20 mg by mouth daily.   Yes Historical Provider, MD    Medications: . acetaminophen  1,000 mg Oral Q6H  . atorvastatin  20 mg Oral Daily  . enoxaparin (LOVENOX) injection  40 mg Subcutaneous Q24H  . metoCLOPramide (REGLAN) injection  10 mg Intravenous Q6H  . pantoprazole (PROTONIX) IV  40 mg Intravenous Q24H  . piperacillin-tazobactam (ZOSYN)  IV  3.375 g Intravenous Q8H  . saccharomyces boulardii  250 mg Oral BID   . lactated ringers 1,000 mL with potassium chloride 20 mEq infusion 100 mL/hr at 08/03/16 2353   Assessment/Plan Ruptured acute suppurative appendicitis S/p  laparoscopic appendectomy 07/25/16, Dr. Jimmye NormanJames Wyatt CT abdomen/pelvis 1/25: Small probable developing abscesses in the right mid and upper pelvis, one measuring 3.6 x 2.6 x 1.2 cm and the other measuring 2.8x 2.1 x 1.2 cm. Post op ileus - NG out 08/02/16 FEN:  IV fluids/full liquids ID:  Zosyn 07/24/16 =>>  Day 12 DVT:  Lovenox  Plan:  I will advance his diet to soft. Recheck all his labs again in the a.m. Discussed duration of antibiotics. His wife is due next week with her fourth child. He is anxious to get home. Day 8 reglan. I will decrease this 5 mg PO TID      LOS: 10 days    Nzinga Ferran 08/04/2016 [id number]

## 2016-08-04 NOTE — Discharge Instructions (Signed)
[facility] ______[facility] °LAPAROSCOPIC SURGERY: POST OP INSTRUCTIONS °Always review your discharge instruction sheet given to you by the facility where your surgery was performed. °IF YOU HAVE DISABILITY OR FAMILY LEAVE FORMS, YOU MUST BRING THEM TO THE OFFICE FOR PROCESSING.   °DO NOT GIVE THEM TO YOUR DOCTOR. ° °1. A prescription for pain medication may be given to you upon discharge.  Take your pain medication as prescribed, if needed.  If narcotic pain medicine is not needed, then you may take acetaminophen (Tylenol) or ibuprofen (Advil) as needed. °2. Take your usually prescribed medications unless otherwise directed. °3. If you need a refill on your pain medication, please contact your pharmacy.  They will contact our office to request authorization. Prescriptions will not be filled after 5pm or on week-ends. °4. You should follow a light diet the first few days after arrival home, such as soup and crackers, etc.  Be sure to include lots of fluids daily. °5. Most patients will experience some swelling and bruising in the area of the incisions.  Ice packs will help.  Swelling and bruising can take several days to resolve.  °6. It is common to experience some constipation if taking pain medication after surgery.  Increasing fluid intake and taking a stool softener (such as Colace) will usually help or prevent this problem from occurring.  A mild laxative (Milk of Magnesia or Miralax) should be taken according to package instructions if there are no bowel movements after 48 hours. °7. Unless discharge instructions indicate otherwise, you may remove your bandages 24-48 hours after surgery, and you may shower at that time.  You may have steri-strips (small skin tapes) in place directly over the incision.  These strips should be left on the skin for 7-10 days.  If your surgeon used skin glue on the incision, you may shower in 24 hours.  The glue will flake off over the next 2-3 weeks.  Any sutures or  staples will be removed at the office during your follow-up visit. °8. ACTIVITIES:  You may resume regular (light) daily activities beginning the next day--such as daily self-care, walking, climbing stairs--gradually increasing activities as tolerated.  You may have sexual intercourse when it is comfortable.  Refrain from any heavy lifting or straining until approved by your doctor. °a. You may drive when you are no longer taking prescription pain medication, you can comfortably wear a seatbelt, and you can safely maneuver your car and apply brakes. °b. RETURN TO WORK:  __________________________________________________________ °9. You should see your doctor in the office for a follow-up appointment approximately 2-3 weeks after your surgery.  Make sure that you call for this appointment within a day or two after you arrive home to insure a convenient appointment time. °10. OTHER INSTRUCTIONS: __________________________________________________________________________________________________________________________ __________________________________________________________________________________________________________________________ °WHEN TO CALL YOUR DOCTOR: °1. Fever over 101.0 °2. Inability to urinate °3. Continued bleeding from incision. °4. Increased pain, redness, or drainage from the incision. °5. Increasing abdominal pain ° °The clinic staff is available to answer your questions during regular business hours.  Please don’t hesitate to call and ask to speak to one of the nurses for clinical concerns.  If you have a medical emergency, go to the nearest emergency room or call 911.  A surgeon from [facility] is always on call at the hospital. °[address], [address], [place], [place]  [address] ? [address] [address], [place], [place]   [address] °[phone] ? [phone] ? FAX [phone] °Web site:   site: [email or web address]   Laparoscopic Appendectomy, Adult, Care  After Refer to this sheet in the next few weeks. These instructions provide you with information about caring for yourself after your procedure. Your health care provider may also give you more specific instructions. Your treatment has been planned according to current medical practices, but problems sometimes occur. Call your health care provider if you have any problems or questions after your procedure. What can I expect after the procedure? After the procedure, it is common to have:  A decrease in your energy level.  Mild pain in the area where the surgical cuts (incisions) were made.  Constipation. This can be caused by pain medicine and a decrease in your activity. Follow these instructions at home: Medicines  Take over-the-counter and prescription medicines only as told by your health care provider.  Do not drive for 24 hours if you received a sedative.  Do not drive or operate heavy machinery while taking prescription pain medicine.  If you were prescribed an antibiotic medicine, take it as told by your health care provider. Do not stop taking the antibiotic even if you start to feel better. Activity  For 3 weeks or as long as told by your health care provider:  Do not lift anything that is heavier than 10 pounds (4.5 kg).  Do not play contact sports.  Gradually return to your normal activities. Ask your health care provider what activities are safe for you. Bathing  Keep your incisions clean and dry. Clean them as often as told by your health care provider:  Gently wash the incisions with soap and water.  Rinse the incisions with water to remove all soap.  Pat the incisions dry with a clean towel. Do not rub the incisions.  You may take showers after 48 hours.  Do not take baths, swim, or use hot tubs for 2 weeks or as told by your health care provider. Incision care  Follow instructions from your healthcare provider about how to take care of your incisions. Make  sure you:  Wash your hands with soap and water before you change your bandage (dressing). If soap and water are not available, use hand sanitizer.  Change your dressing as told by your health care provider.  Leave stitches (sutures), skin glue, or adhesive strips in place. These skin closures may need to stay in place for 2 weeks or longer. If adhesive strip edges start to loosen and curl up, you may trim the loose edges. Do not remove adhesive strips completely unless your health care provider tells you to do that.  Check your incision areas every day for signs of infection. Check for:  More redness, swelling, or pain.  More fluid or blood.  Warmth.  Pus or a bad smell. Other Instructions  If you were sent home with a drain, follow instructions from your health care provider about how to care for the drain and how to empty it.  Take deep breaths. This helps to prevent your lungs from becoming inflamed.  To relieve and prevent constipation:  Drink plenty of fluids.  Eat plenty of fruits and vegetables.  Keep all follow-up visits as told by your health care provider. This is important. Contact a health care provider if:  You have more redness, swelling, or pain around an incision.  You have more fluid or blood coming from an incision.  Your incision feels warm to the touch.  You have pus or a bad smell coming from an incision or  dressing.  Your incision edges break open after your sutures have been removed.  You have increasing pain in your shoulders.  You feel dizzy or you faint.  You develop shortness of breath.  You keep feeling nauseous or vomiting.  You have diarrhea or you cannot control your bowel functions.  You lose your appetite.  You develop swelling or pain in your legs. Get help right away if:  You have a fever.  You develop a rash.  You have difficulty breathing.  You have sharp pains in your chest. This information is not intended to  replace advice given to you by your health care provider. Make sure you discuss any questions you have with your health care provider. Document Released: 06/22/2005 Document Revised: 11/22/2015 Document Reviewed: 12/10/2014 Elsevier Interactive Patient Education  2017 [employer].  Best of luck with # 4.

## 2016-08-04 NOTE — Discharge Summary (Signed)
Physician Discharge Summary  Patient ID: Ronnie Jordan MRN: [id number] DOB/AGE: [birth year] 31 y.o.  Admit date: 07/24/2016 Discharge date: 08/05/2016   Admission Diagnoses:  Acute appendicitis with possible early rupture and an appendicolith Hx of ADHD  Discharge Diagnoses:  Rupture acute suppurative appendicitis Post op ileus Reflux post op   Active Problems:   Ileus following gastrointestinal surgery   Ruptured suppurative appendicitis   PROCEDURES: Laparoscopic appendectomy, 07/25/16, Dr. Vergia AlbertsJames Wyatt  Hospital Course: Patient has been sick for over one week with abdominal pain, worsened so much the last 24 hours that he came to the ED where he was suspectd of having acute appendicitis.  CT scan confirmed the diagnosis with possible perforation.  He was seen in the ED and taken to the OR ASAP.  He was found to have a ruptured suppurative appendix.  Post op he had a prolonged course with an ileus and was slow to recover.  A repeat CT scan on 07/30/16 showed: Status post appendectomy with interval distal small bowel inflammatory changes, causing partial small bowel obstruction.  Small probable developing abscesses in the right mid and upper pelvis, one measuring 3.6 x 2.6 x 1.2 cm and the other measuring 2.8 x 2.1 x 1.2 cm. He was maintained on IV zosyn and eventually placed on Reglan 07/28/16, to assist in SB motility.  Ng was finally removed on  08/02/16, and he was slowly advanced. By the PM of 1/29 we advanced him to a full liquid diet. He was increased to soft diet on 08/04/16. He was having some loose stools and his Reglan was converted from 10 mg q6h IV to 5 mg PO TID.  He is tolerated advancement of his diet. Ambulating well, port sites all look good. Abdomen soft and not distended. Labs shows white count is normal as are his remaining labs. At this point it was Dr. Tawana ScaleWilson's opinion he can go home. We are discontinuing the antibiotics, after 12 days of IV Zosyn. We are also discontinuing  the Reglan. We had long discussions with him and his wife about postop abscess formation, and to call if there are any issues. His wife is due with therefore son in about 1 week. His creatinine is normal, but at the upper limits, so I recommended he stick to Tylenol and oxycodone when necessary for pain.     CBC Latest Ref Rng & Units 08/05/2016 08/03/2016 08/01/2016  WBC 4.0 - 10.5 K/uL 8.1 9.3 11.3(H)  Hemoglobin 13.0 - 17.0 g/dL 78.214.3 12.6(L) 13.7  Hematocrit 39.0 - 52.0 % 41.4 37.8(L) 40.5  Platelets 150 - 400 K/uL 338 270 300    CMP Latest Ref Rng & Units 08/05/2016 08/04/2016 08/03/2016  Glucose 65 - 99 mg/dL 96 88 85  BUN 6 - 20 mg/dL 6 <9(F<5(L) 8  Creatinine 6.210.61 - 1.24 mg/dL 3.081.22 6.571.20 8.461.12  Sodium 135 - 145 mmol/L 136 137 137  Potassium 3.5 - 5.1 mmol/L 4.1 4.0 3.7  Chloride 101 - 111 mmol/L 101 100(L) 105  CO2 22 - 32 mmol/L 26 23 23   Calcium 8.9 - 10.3 mg/dL 9.1 9.0 9.6(E8.6(L)  Total Protein 6.5 - 8.1 g/dL - 6.6 -  Total Bilirubin 0.3 - 1.2 mg/dL - 1.0 -  Alkaline Phos 38 - 126 U/L - 103 -  AST 15 - 41 U/L - 33 -  ALT 17 - 63 U/L - 87(H) -    Condition ON discharge: Improved   Disposition: 01-Home or Self Care   Allergies as of 08/05/2016  No Known Allergies     Medication List    TAKE these medications   acetaminophen 500 MG tablet Commonly known as:  TYLENOL Take 2 tablets (1,000 mg total) by mouth every 6 (six) hours as needed for mild pain, moderate pain, fever or headache.   amphetamine-dextroamphetamine 10 MG 24 hr capsule Commonly known as:  ADDERALL XR Take 10 mg by mouth daily as needed (ADHD).   atorvastatin 20 MG tablet Commonly known as:  LIPITOR Take 20 mg by mouth daily.   oxyCODONE 5 MG immediate release tablet Commonly known as:  Oxy IR/ROXICODONE Take 1-2 tablets (5-10 mg total) by mouth every 4 (four) hours as needed for moderate pain.   saccharomyces boulardii 250 MG capsule Commonly known as:  FLORASTOR You can buy this over the counter and  use for the next week or more till bowel movements appear normal.      Follow-up Information    [facility] Follow up on 08/18/2016.   Specialty:  General Surgery Why:  Your appointment is at 11:15 AM, be at the office 30 minutes early for check in.  The office will call you and set you up for a lab test before you return to see [place] in the clinic. If you start to feel bad, have any fever, trouble voiding.  Give [place] a call. Contact information: [address] [place] [address] [phone]           Signed: Sherrie George 08/05/2016, 2:20 PM

## 2016-08-05 LAB — CBC
HEMATOCRIT: 41.4 % (ref 39.0–52.0)
HEMOGLOBIN: 14.3 g/dL (ref 13.0–17.0)
MCH: 29.1 pg (ref 26.0–34.0)
MCHC: 34.5 g/dL (ref 30.0–36.0)
MCV: 84.3 fL (ref 78.0–100.0)
Platelets: 338 10*3/uL (ref 150–400)
RBC: 4.91 MIL/uL (ref 4.22–5.81)
RDW: 12.6 % (ref 11.5–15.5)
WBC: 8.1 10*3/uL (ref 4.0–10.5)

## 2016-08-05 LAB — BASIC METABOLIC PANEL
Anion gap: 9 (ref 5–15)
BUN: 6 mg/dL (ref 6–20)
CO2: 26 mmol/L (ref 22–32)
Calcium: 9.1 mg/dL (ref 8.9–10.3)
Chloride: 101 mmol/L (ref 101–111)
Creatinine, Ser: 1.22 mg/dL (ref 0.61–1.24)
GFR calc Af Amer: >60 mL/min (ref >60)
GFR calc non Af Amer: >60 mL/min (ref >60)
GLUCOSE: 96 mg/dL (ref 65–99)
POTASSIUM: 4.1 mmol/L (ref 3.5–5.1)
Sodium: 136 mmol/L (ref 135–145)

## 2016-08-05 MED ORDER — OXYCODONE HCL 5 MG PO TABS: 5.0000 mg | ORAL_TABLET | ORAL | 0 refills | Status: DC | PRN

## 2016-08-05 MED ORDER — SACCHAROMYCES BOULARDII 250 MG PO CAPS: ORAL_CAPSULE | ORAL | Status: DC

## 2016-08-05 MED ORDER — ACETAMINOPHEN 500 MG PO TABS: 1000.0000 mg | ORAL_TABLET | Freq: Four times a day (QID) | ORAL | 0 refills | Status: DC | PRN

## 2016-08-05 NOTE — Progress Notes (Signed)
11 Days Post-Op  Subjective: He is doing well this Am.  Sites all look fine, tolerating his diet.  No significant pain or discomfort.    Objective: Vital signs in last 24 hours: Temp:  [98 F (36.7 C)-98.4 F (36.9 C)] 98 F (36.7 C) (01/31 0536) Pulse Rate:  [70-75] 75 (01/31 0536) Resp:  [18] 18 (01/31 0536) BP: (113-127)/(78-80) 125/78 (01/31 0536) SpO2:  [100 %] 100 % (01/31 0536) Last BM Date: 08/04/16 360 PO + BM urine 250 recorded Afebrile, VSS WBC is normal and BMP is normal  Intake/Output from previous day: 01/30 0701 - 01/31 0700 In: 360 [P.O.:360] Out: 250 [Urine:250] Intake/Output this shift: No intake/output data recorded.  General appearance: alert, cooperative and no distress Resp: clear to auscultation bilaterally GI: soft, sore, sites all look fine , stools still loose.  Lab Results:   Recent Labs  08/03/16 0517 08/05/16 0542  WBC 9.3 8.1  HGB 12.6* 14.3  HCT 37.8* 41.4  PLT 270 338    BMET  Recent Labs  08/04/16 1127 08/05/16 0542  NA 137 136  K 4.0 4.1  CL 100* 101  CO2 23 26  GLUCOSE 88 96  BUN <5* 6  CREATININE 1.20 1.22  CALCIUM 9.0 9.1   PT/INR No results for input(s): LABPROT, INR in the last 72 hours.   Recent Labs Lab 08/04/16 1127  AST 33  ALT 87*  ALKPHOS 103  BILITOT 1.0  PROT 6.6  ALBUMIN 3.3*     Lipase     Component Value Date/Time   LIPASE 18 07/24/2016 2259     Studies/Results: No results found.  Medications: . atorvastatin  20 mg Oral Daily  . enoxaparin (LOVENOX) injection  40 mg Subcutaneous Q24H  . famotidine  20 mg Oral Daily  . metoCLOPramide  5 mg Oral TID AC  . piperacillin-tazobactam (ZOSYN)  IV  3.375 g Intravenous Q8H  . saccharomyces boulardii  250 mg Oral BID    Assessment/Plan Ruptured acute suppurative appendicitis S/p  laparoscopic appendectomy 07/25/16, Dr. Jimmye NormanJames Wyatt CT abdomen/pelvis 1/25: Small probable developing abscesses in the right mid and upper pelvis, one  measuring 3.6 x 2.6 x 1.2 cm and the other measuring 2.8x 2.1 x 1.2 cm. Post op ileus - NG out 08/02/16 FEN:  IV fluids/full liquids ID:  Zosyn 07/24/16 =>>  Day 12 DVT:  Lovenox    Plan:  DC antibiotics, Reglan and discharge home today.  Follow up with [facility] with CBC.     LOS: 11 days    Ronnie Jordan 08/05/2016 [id number]

## 2016-08-05 NOTE — Progress Notes (Signed)
Patient discharged to home with instructions and prescriptions. 

## 2016-08-14 DIAGNOSIS — S39012A Strain of muscle, fascia and tendon of lower back, initial encounter: Secondary | ICD-10-CM | POA: Diagnosis not present

## 2016-08-14 DIAGNOSIS — Z9049 Acquired absence of other specified parts of digestive tract: Secondary | ICD-10-CM | POA: Diagnosis not present

## 2016-08-20 DIAGNOSIS — S39012A Strain of muscle, fascia and tendon of lower back, initial encounter: Secondary | ICD-10-CM | POA: Diagnosis not present

## 2017-01-20 DIAGNOSIS — Z0000 Encounter for general adult medical examination without abnormal findings: Secondary | ICD-10-CM | POA: Diagnosis not present

## 2017-01-20 DIAGNOSIS — E785 Hyperlipidemia, unspecified: Secondary | ICD-10-CM | POA: Diagnosis not present

## 2017-04-19 DIAGNOSIS — R740 Nonspecific elevation of levels of transaminase and lactic acid dehydrogenase [LDH]: Secondary | ICD-10-CM | POA: Diagnosis not present

## 2017-05-31 DIAGNOSIS — R740 Nonspecific elevation of levels of transaminase and lactic acid dehydrogenase [LDH]: Secondary | ICD-10-CM | POA: Diagnosis not present

## 2017-07-30 DIAGNOSIS — Z79899 Other long term (current) drug therapy: Secondary | ICD-10-CM | POA: Diagnosis not present

## 2017-07-30 DIAGNOSIS — E785 Hyperlipidemia, unspecified: Secondary | ICD-10-CM | POA: Diagnosis not present

## 2017-12-01 DIAGNOSIS — J029 Acute pharyngitis, unspecified: Secondary | ICD-10-CM | POA: Diagnosis not present

## 2018-01-21 DIAGNOSIS — Z0000 Encounter for general adult medical examination without abnormal findings: Secondary | ICD-10-CM | POA: Diagnosis not present

## 2018-01-21 DIAGNOSIS — E785 Hyperlipidemia, unspecified: Secondary | ICD-10-CM | POA: Diagnosis not present

## 2018-03-03 DIAGNOSIS — M25562 Pain in left knee: Secondary | ICD-10-CM | POA: Diagnosis not present

## 2018-03-08 ENCOUNTER — Other Ambulatory Visit: Payer: Self-pay | Admitting: Orthopedic Surgery

## 2018-03-08 DIAGNOSIS — R52 Pain, unspecified: Secondary | ICD-10-CM

## 2018-03-11 ENCOUNTER — Inpatient Hospital Stay
Admission: RE | Admit: 2018-03-11 | Discharge: 2018-03-11 | Disposition: A | Discharge status: Home / Self Care | Payer: 59 | Source: Ambulatory Visit | Attending: Orthopedic Surgery | Admitting: Orthopedic Surgery

## 2018-03-16 ENCOUNTER — Ambulatory Visit
Admission: RE | Admit: 2018-03-16 | Discharge: 2018-03-16 | Disposition: A | Discharge status: Home / Self Care | Payer: 59 | Source: Ambulatory Visit | Attending: Orthopedic Surgery | Admitting: Orthopedic Surgery

## 2018-03-16 DIAGNOSIS — M25562 Pain in left knee: Secondary | ICD-10-CM | POA: Diagnosis not present

## 2018-03-16 DIAGNOSIS — R52 Pain, unspecified: Secondary | ICD-10-CM

## 2018-05-18 DIAGNOSIS — M659 Synovitis and tenosynovitis, unspecified: Secondary | ICD-10-CM | POA: Diagnosis not present

## 2018-05-18 DIAGNOSIS — M94262 Chondromalacia, left knee: Secondary | ICD-10-CM | POA: Diagnosis not present

## 2018-05-18 DIAGNOSIS — S83232A Complex tear of medial meniscus, current injury, left knee, initial encounter: Secondary | ICD-10-CM | POA: Diagnosis not present

## 2018-05-18 DIAGNOSIS — S83262A Peripheral tear of lateral meniscus, current injury, left knee, initial encounter: Secondary | ICD-10-CM | POA: Diagnosis not present

## 2018-05-18 DIAGNOSIS — G8918 Other acute postprocedural pain: Secondary | ICD-10-CM | POA: Diagnosis not present

## 2018-05-24 DIAGNOSIS — S83272A Complex tear of lateral meniscus, current injury, left knee, initial encounter: Secondary | ICD-10-CM | POA: Diagnosis not present

## 2018-05-24 DIAGNOSIS — Z9889 Other specified postprocedural states: Secondary | ICD-10-CM | POA: Diagnosis not present

## 2019-08-06 IMAGING — MR MR KNEE*L* W/O CM
5 of 7 series · 22 of 40 positions shown · non-contrast
Comparison: None.

CLINICAL DATA: Medial left knee pain.  Swelling for 3 weeks.

EXAM:
MRI OF THE LEFT KNEE WITHOUT CONTRAST
TECHNIQUE: Multiplanar, multisequence MR imaging of the knee was performed. No
intravenous contrast was administered.

[Series 4: T2 fat-sat · axial · 4.0mm · 0.50mm/px · z∈[-40,+95]mm · 6 of 28 slices shown (1 of 2)]
[im 1/28]
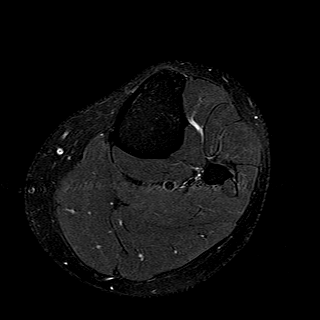
[im 6/28]
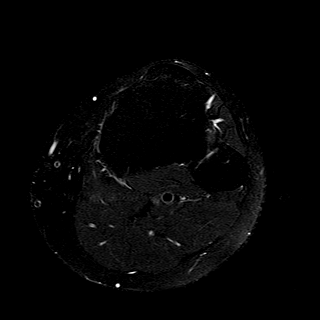
[im 11/28]
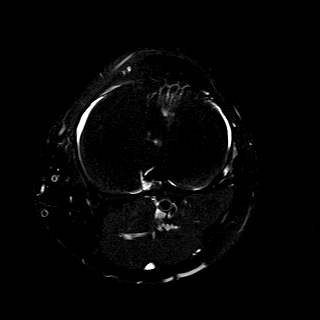
[im 17/28]
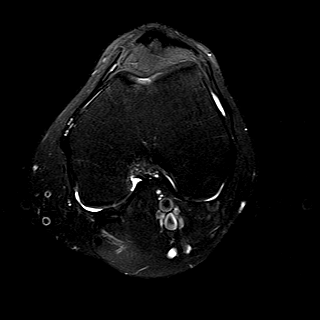
[im 22/28]
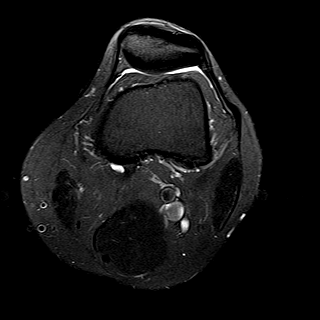
[im 28/28]
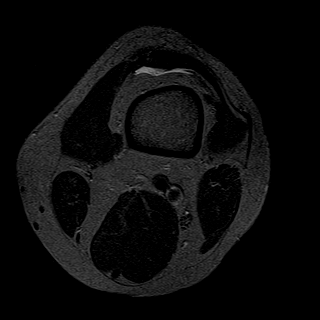

[Series 6: T2 fat-sat · sagittal · 3.0mm · 0.29mm/px · 1 of 33 slices shown (2 of 2)]
[im 1/33]
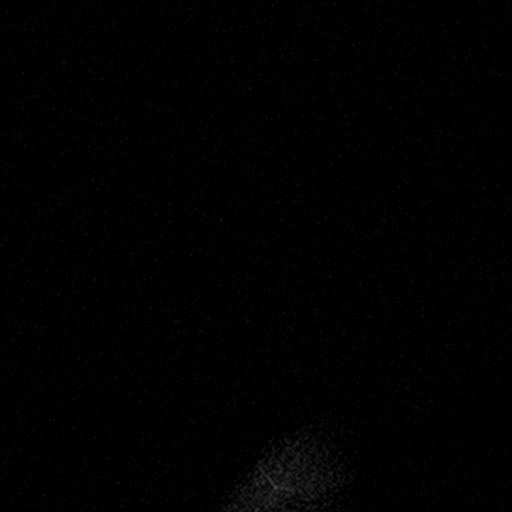

[Series 8: PD fat-sat · coronal · 4.0mm · 0.29mm/px · 6 of 32 slices shown (1 of 3)]
[im 1/32]
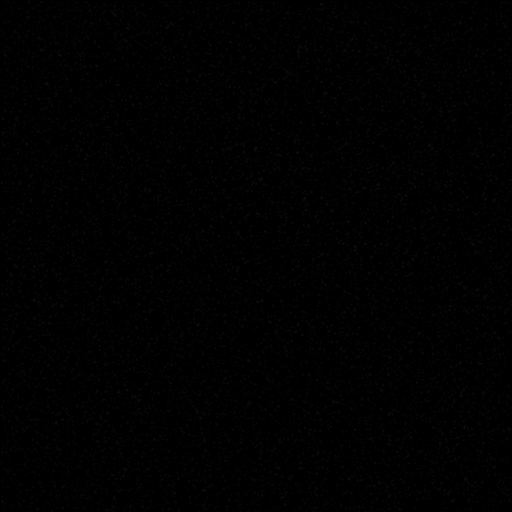
[im 7/32]
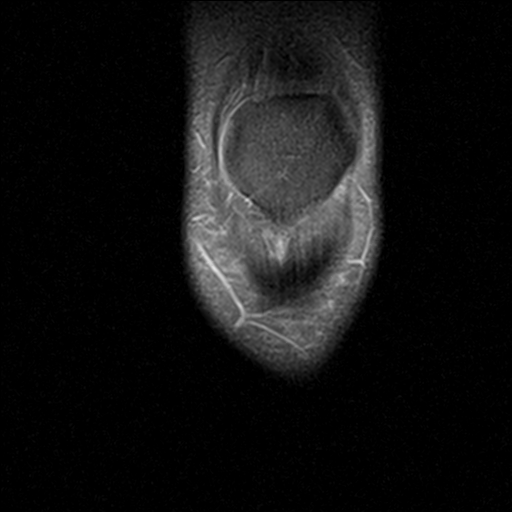
[im 13/32]
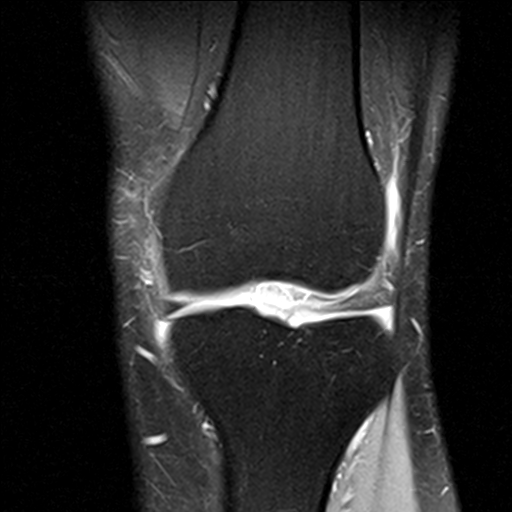
[im 19/32]
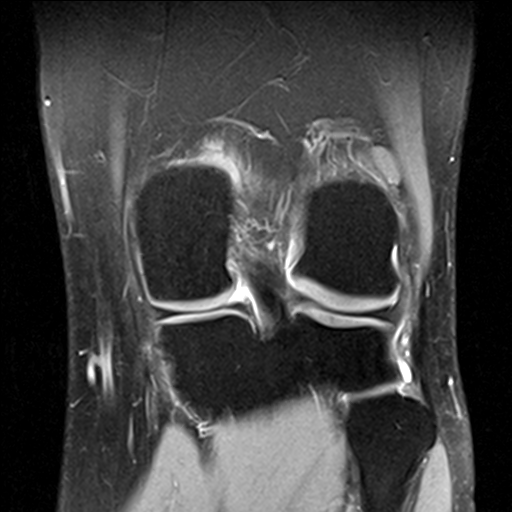
[im 25/32]
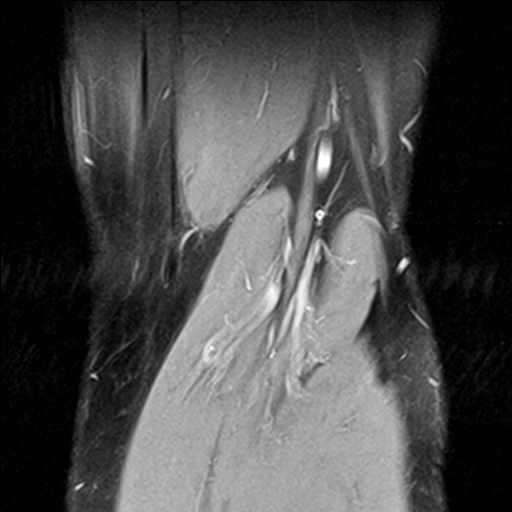
[im 32/32]
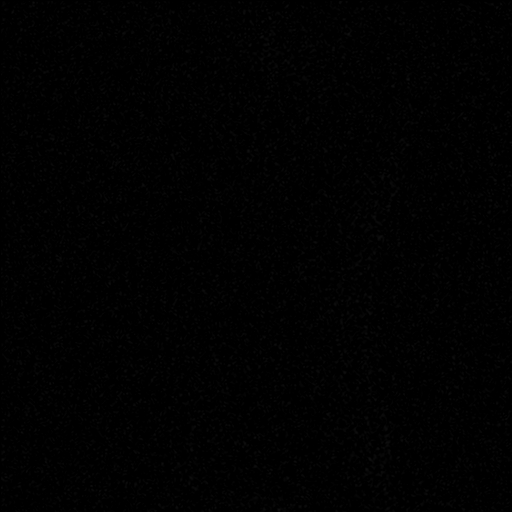

[Series 9: PD fat-sat · sagittal · 3.0mm · 0.29mm/px · 6 of 33 slices shown (2 of 3)]
[im 1/33]
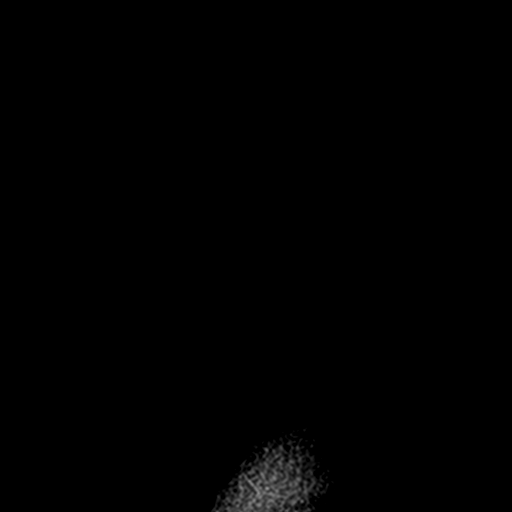
[im 7/33]
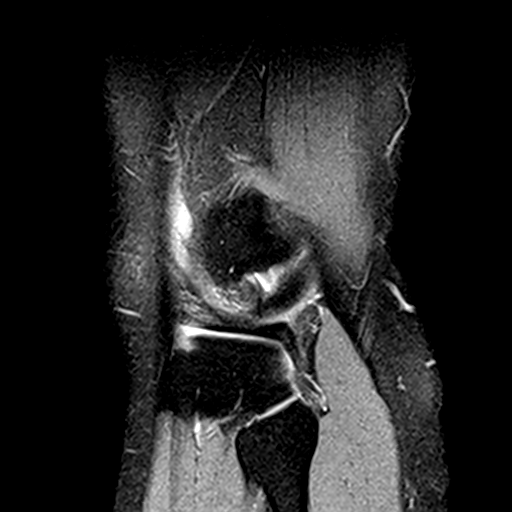
[im 13/33]
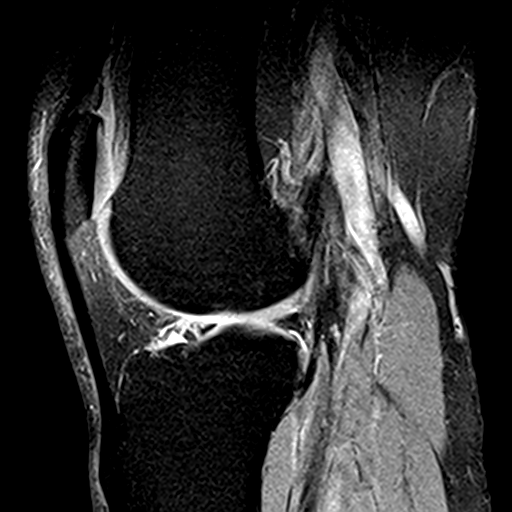
[im 20/33]
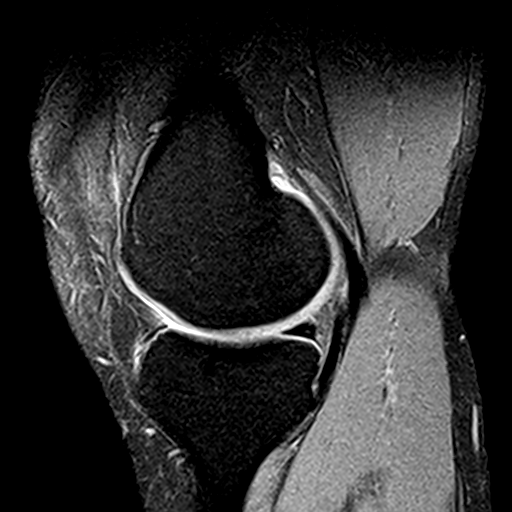
[im 26/33]
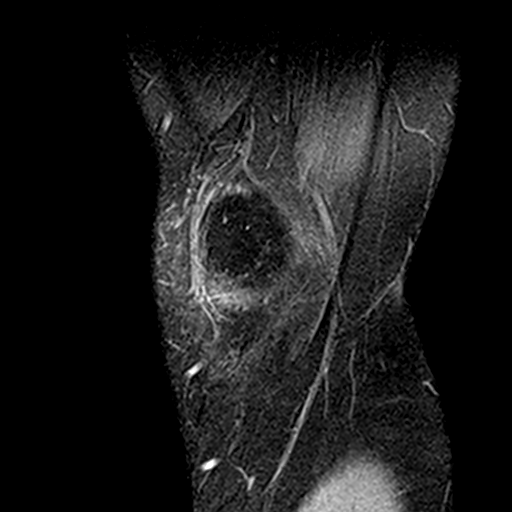
[im 33/33]
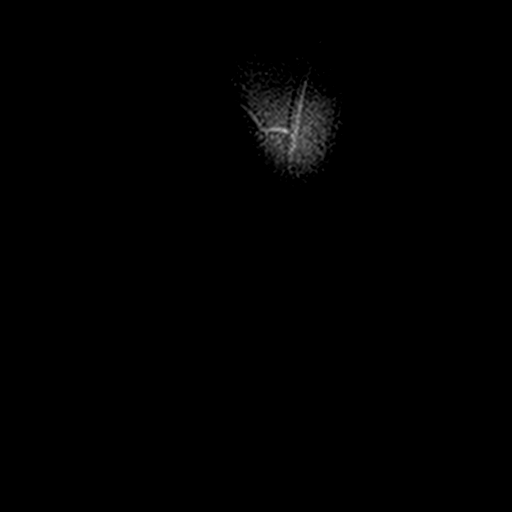

[Series 10: PD fat-sat · oblique · 2.3mm · 0.29mm/px · 3 of 15 slices shown (3 of 3)]
[im 1/15]
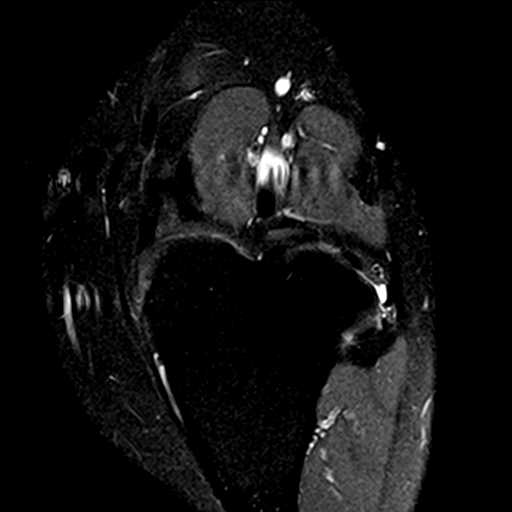
[im 8/15]
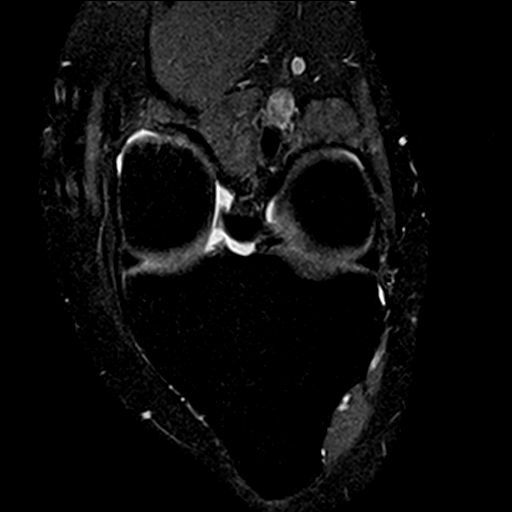
[im 15/15]
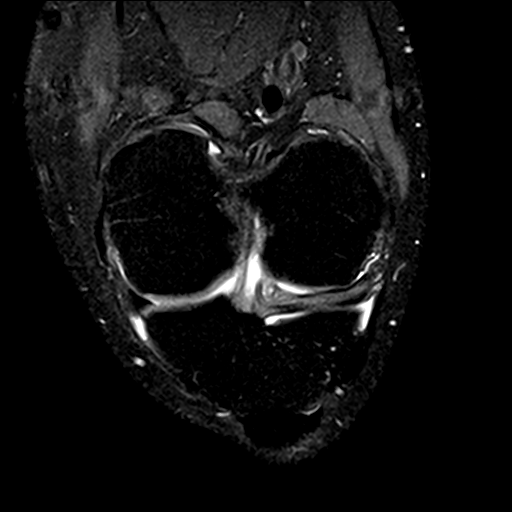

[22 of 40 positions shown; findings below may reference images not displayed]

FINDINGS: MENISCI

Medial meniscus:  Intact.

Lateral meniscus: Increased signal in the anterior horn of the
lateral meniscus consistent with degeneration with an undersurface
tear.

LIGAMENTS

Cruciates:  Intact ACL and PCL.

Collaterals: Medial collateral ligament is intact. Lateral
collateral ligament complex is intact.

CARTILAGE

Patellofemoral: 7 x 12 mm chondral defect involving trochlear groove
with minimal subchondral reactive marrow changes.

Medial:  No chondral defect.

Lateral:  No chondral defect.

Joint:  No joint effusion. Normal Hoffa's fat. No plical thickening.

Popliteal Fossa:  No Baker cyst. Intact popliteus tendon.

Extensor Mechanism: Intact quadriceps tendon. Intact patellar
tendon. Intact medial patellar retinaculum. Intact lateral patellar
retinaculum. Intact MPFL.

Bones: No focal marrow signal abnormality. No fracture or
dislocation.

Other: No fluid collection or hematoma.  Muscles are normal.
IMPRESSION: 1. Increased signal in the anterior horn of the lateral meniscus
consistent with degeneration with an undersurface tear.
2. 7 x 12 mm chondral defect involving trochlear groove with minimal
subchondral reactive marrow changes.

## 2019-12-20 DIAGNOSIS — Z0000 Encounter for general adult medical examination without abnormal findings: Secondary | ICD-10-CM | POA: Diagnosis not present

## 2019-12-20 DIAGNOSIS — E785 Hyperlipidemia, unspecified: Secondary | ICD-10-CM | POA: Diagnosis not present

## 2020-02-09 DIAGNOSIS — F331 Major depressive disorder, recurrent, moderate: Secondary | ICD-10-CM | POA: Diagnosis not present

## 2020-03-29 DIAGNOSIS — G4700 Insomnia, unspecified: Secondary | ICD-10-CM | POA: Diagnosis not present

## 2020-03-29 DIAGNOSIS — F331 Major depressive disorder, recurrent, moderate: Secondary | ICD-10-CM | POA: Diagnosis not present

## 2020-03-29 DIAGNOSIS — Z23 Encounter for immunization: Secondary | ICD-10-CM | POA: Diagnosis not present

## 2020-06-21 DIAGNOSIS — F331 Major depressive disorder, recurrent, moderate: Secondary | ICD-10-CM | POA: Diagnosis not present

## 2022-03-06 DIAGNOSIS — G4700 Insomnia, unspecified: Secondary | ICD-10-CM | POA: Diagnosis not present

## 2022-03-06 DIAGNOSIS — F3340 Major depressive disorder, recurrent, in remission, unspecified: Secondary | ICD-10-CM | POA: Diagnosis not present

## 2022-03-06 DIAGNOSIS — F4010 Social phobia, unspecified: Secondary | ICD-10-CM | POA: Diagnosis not present

## 2022-03-06 DIAGNOSIS — E785 Hyperlipidemia, unspecified: Secondary | ICD-10-CM | POA: Diagnosis not present

## 2022-03-06 DIAGNOSIS — Z0000 Encounter for general adult medical examination without abnormal findings: Secondary | ICD-10-CM | POA: Diagnosis not present

## 2022-03-31 ENCOUNTER — Encounter (INDEPENDENT_AMBULATORY_CARE_PROVIDER_SITE_OTHER): Payer: Self-pay | Admitting: Internal Medicine

## 2022-03-31 ENCOUNTER — Ambulatory Visit (INDEPENDENT_AMBULATORY_CARE_PROVIDER_SITE_OTHER): Payer: BC Managed Care – PPO | Admitting: Internal Medicine

## 2022-03-31 VITALS — BP 115/75 | HR 94 | Temp 99.3°F | Ht 71.0 in | Wt 221.0 lb

## 2022-03-31 DIAGNOSIS — Z6830 Body mass index (BMI) 30.0-30.9, adult: Secondary | ICD-10-CM | POA: Diagnosis not present

## 2022-03-31 DIAGNOSIS — R5383 Other fatigue: Secondary | ICD-10-CM | POA: Diagnosis not present

## 2022-03-31 DIAGNOSIS — E669 Obesity, unspecified: Secondary | ICD-10-CM | POA: Diagnosis not present

## 2022-03-31 DIAGNOSIS — Z0289 Encounter for other administrative examinations: Secondary | ICD-10-CM

## 2022-03-31 NOTE — Progress Notes (Unsigned)
Office: [phone]  /  Fax: [phone]  Initial Visit  Ronnie Jordan was seen in clinic today to evaluate for obesity. He is interested in losing weight to improve overall health and reduce the risk of weight related complications. He works at [employer] for [employer], is married and has 4 boys.  He has family history of obesity and is trying to be proactive. Has recently started a reduced calorie meal plan but feels may not be meeting his nutritional requirements. Has lost 10 lbs thus far. Associated conditions: elevated liver enzymes (? Of NAFLD) , HLD on statin. He endorses some fatigue with exertion.  He presents today to review program treatment options, initial physical assessment, and evaluation.      Past medical history includes:  History reviewed. No pertinent past medical history.   Objective:   BP 115/75   Pulse 94   Temp 99.3 F (37.4 C)   Ht 5\' 11"  (1.803 m)   Wt 221 lb (100.2 kg)   SpO2 98%   BMI 30.82 kg/m  He was weighed on the bioimpedance scale:  Body mass index is 30.82 kg/m.  General:  Alert, oriented and cooperative. Patient is in no acute distress.  Respiratory: Normal respiratory effort, no problems with respiration noted  Extremities: Normal range of motion.    Mental Status: Normal mood and affect. Normal behavior. Normal judgment and thought content.   Assessment and Plan:  1. Class 1 obesity without serious comorbidity with body mass index (BMI) of 30.0 to 30.9 in adult, unspecified obesity type  2. Other fatigue - EKG 12-Lead      Obesity Treatment Plan:  He will work on garnering support from family and friends to begin weight loss journey. Work on eliminating or reducing the presence of highly processed, calorie dense foods in the home. Complete provided nutritional and psychosocial assessment questionnaire.    Ronnie Jordan will follow up in the next 1-2 weeks to review the above steps and continue evaluation and treatment. Advised to resume  previous diet to obtain a more representative baseline BMR.  Obesity Education Performed Today:  He was weighed on the bioimpedance scale and results were discussed and documented in the synopsis.  We discussed obesity as a disease and the importance of a more detailed evaluation of all the factors contributing to the disease.  We discussed the importance of long term lifestyle changes which include nutrition, exercise and behavioral modifications as well as the importance of customizing this to his specific health and social needs.  We discussed the benefits of reaching a healthier weight to alleviate the symptoms of existing conditions and reduce the risks of the biomechanical, metabolic and psychological effects of obesity.  We discussed the goals of this program is to improve his overall health and not simply achieve a specific BMI.  Frequent visits are very important to patient success. I plan to see him every 2 weeks for the first 3 months and then evaluate the visit frequency after that time. I explained obesity is a life-long chronic disease and long term treatments would be required. Medications to help him follow his eating plan may be offered as appropriate but are not required. All medication decisions will be made together after the initial workup is done and benefits and side effects are discussed in depth.  The clinic rules were reviewed including the late policy, cancellation policy, no show and program fees.  Ronnie Jordan appears to be in the action stage of change and states they  are ready to start intensive lifestyle modifications and behavioral modifications.  30 minutes was spent today on this visit including the above counseling, pre-visit chart review, and post-visit documentation.  Ronnie Dinning, MD

## 2022-04-07 ENCOUNTER — Ambulatory Visit (INDEPENDENT_AMBULATORY_CARE_PROVIDER_SITE_OTHER): Payer: BC Managed Care – PPO | Admitting: Internal Medicine

## 2022-04-07 ENCOUNTER — Encounter (INDEPENDENT_AMBULATORY_CARE_PROVIDER_SITE_OTHER): Payer: Self-pay | Admitting: Internal Medicine

## 2022-04-07 VITALS — BP 109/78 | HR 76 | Temp 97.6°F | Ht 71.0 in | Wt 224.0 lb

## 2022-04-07 DIAGNOSIS — Z6831 Body mass index (BMI) 31.0-31.9, adult: Secondary | ICD-10-CM

## 2022-04-07 DIAGNOSIS — R748 Abnormal levels of other serum enzymes: Secondary | ICD-10-CM | POA: Insufficient documentation

## 2022-04-07 DIAGNOSIS — R0602 Shortness of breath: Secondary | ICD-10-CM | POA: Diagnosis not present

## 2022-04-07 DIAGNOSIS — Z1331 Encounter for screening for depression: Secondary | ICD-10-CM

## 2022-04-07 DIAGNOSIS — E7849 Other hyperlipidemia: Secondary | ICD-10-CM | POA: Insufficient documentation

## 2022-04-07 DIAGNOSIS — E669 Obesity, unspecified: Secondary | ICD-10-CM | POA: Diagnosis not present

## 2022-04-07 DIAGNOSIS — R5383 Other fatigue: Secondary | ICD-10-CM | POA: Insufficient documentation

## 2022-04-08 LAB — COMPREHENSIVE METABOLIC PANEL
ALT: 119 IU/L — ABNORMAL HIGH (ref 0–44)
AST: 40 IU/L (ref 0–40)
Albumin/Globulin Ratio: 2.7 — ABNORMAL HIGH (ref 1.2–2.2)
Albumin: 5.2 g/dL — ABNORMAL HIGH (ref 4.1–5.1)
Alkaline Phosphatase: 134 IU/L — ABNORMAL HIGH (ref 44–121)
BUN/Creatinine Ratio: 14 (ref 9–20)
BUN: 14 mg/dL (ref 6–20)
Bilirubin Total: 0.3 mg/dL (ref 0.0–1.2)
CO2: 25 mmol/L (ref 20–29)
Calcium: 9.8 mg/dL (ref 8.7–10.2)
Chloride: 101 mmol/L (ref 96–106)
Creatinine, Ser: 1 mg/dL (ref 0.76–1.27)
Globulin, Total: 1.9 g/dL (ref 1.5–4.5)
Glucose: 92 mg/dL (ref 70–99)
Potassium: 4.5 mmol/L (ref 3.5–5.2)
Sodium: 140 mmol/L (ref 134–144)
Total Protein: 7.1 g/dL (ref 6.0–8.5)
eGFR: 99 mL/min/{1.73_m2} (ref >59)

## 2022-04-08 LAB — CBC WITH DIFFERENTIAL/PLATELET
Basophils Absolute: 0 10*3/uL (ref 0.0–0.2)
Basos: 1 %
EOS (ABSOLUTE): 0.1 10*3/uL (ref 0.0–0.4)
Eos: 2 %
Hematocrit: 46.5 % (ref 37.5–51.0)
Hemoglobin: 15.9 g/dL (ref 13.0–17.7)
Immature Grans (Abs): 0 10*3/uL (ref 0.0–0.1)
Immature Granulocytes: 0 %
Lymphocytes Absolute: 1.3 10*3/uL (ref 0.7–3.1)
Lymphs: 23 %
MCH: 29.9 pg (ref 26.6–33.0)
MCHC: 34.2 g/dL (ref 31.5–35.7)
MCV: 88 fL (ref 79–97)
Monocytes Absolute: 0.5 10*3/uL (ref 0.1–0.9)
Monocytes: 9 %
Neutrophils Absolute: 3.7 10*3/uL (ref 1.4–7.0)
Neutrophils: 65 %
Platelets: 244 10*3/uL (ref 150–450)
RBC: 5.31 x10E6/uL (ref 4.14–5.80)
RDW: 12.5 % (ref 11.6–15.4)
WBC: 5.6 10*3/uL (ref 3.4–10.8)

## 2022-04-08 LAB — LIPID PANEL
Chol/HDL Ratio: 4.4 ratio (ref 0.0–5.0)
Cholesterol, Total: 157 mg/dL (ref 100–199)
HDL: 36 mg/dL — ABNORMAL LOW (ref >39)
LDL Chol Calc (NIH): 105 mg/dL — ABNORMAL HIGH (ref 0–99)
Triglycerides: 82 mg/dL (ref 0–149)
VLDL Cholesterol Cal: 16 mg/dL (ref 5–40)

## 2022-04-08 LAB — VITAMIN B12: Vitamin B-12: 553 pg/mL (ref 232–1245)

## 2022-04-08 LAB — HEMOGLOBIN A1C
Est. average glucose Bld gHb Est-mCnc: 105 mg/dL
Hgb A1c MFr Bld: 5.3 % (ref 4.8–5.6)

## 2022-04-08 LAB — T4, FREE: Free T4: 1.2 ng/dL (ref 0.82–1.77)

## 2022-04-08 LAB — VITAMIN D 25 HYDROXY (VIT D DEFICIENCY, FRACTURES): Vit D, 25-Hydroxy: 36.8 ng/mL (ref 30.0–100.0)

## 2022-04-08 LAB — INSULIN, RANDOM: INSULIN: 15.8 u[IU]/mL (ref 2.6–24.9)

## 2022-04-08 LAB — TSH: TSH: 3.56 u[IU]/mL (ref 0.450–4.500)

## 2022-04-14 NOTE — Progress Notes (Signed)
Chief Complaint:   OBESITY Ronnie Jordan (MR# [id number]) is a 36 y.o. male who presents for evaluation and treatment of obesity and related comorbidities. Current BMI is Body mass index is 31.24 kg/m. Ronnie Jordan has been struggling with his weight for many years and has been unsuccessful in either losing weight, maintaining weight loss, or reaching his healthy weight goal.  Ronnie Jordan's peak weight is 235 pounds, and desired weight is 185 pounds.  He has tried several diets in the past with limited success.  Physical activity is walking 3 times per week for 30 to 45 minutes.  He acknowledges sometimes eating due to stress or boredom.  He notes traveling would be difficult when dieting for him.  Body fat percentage and visceral fat not too high.   Ronnie Jordan is currently in the action stage of change and ready to dedicate time achieving and maintaining a healthier weight. Ronnie Jordan is interested in becoming our patient and working on intensive lifestyle modifications including (but not limited to) diet and exercise for weight loss.  Ronnie Jordan's habits were reviewed today and are as follows: His family eats meals together, he thinks his family will eat healthier with him, his desired weight loss is 39 lbs, he started gaining weight at 33, his heaviest weight ever was 235 pounds, he is a picky eater and doesn't like to eat healthier foods, he has significant food cravings issues, he snacks frequently in the evenings, he is frequently drinking liquids with calories, he frequently makes poor food choices, he frequently eats larger portions than normal, and he struggles with emotional eating.  Depression Screen Ronnie Jordan's Food and Mood (modified PHQ-9) score was 6.     04/07/2022    7:41 AM  Depression screen PHQ 2/9  Decreased Interest 1  Down, Depressed, Hopeless 0  PHQ - 2 Score 1  Altered sleeping 1  Tired, decreased energy 2  Change in appetite 1  Feeling bad or failure about yourself  0  Trouble  concentrating 1  Moving slowly or fidgety/restless 0  Suicidal thoughts 0  PHQ-9 Score 6  Difficult doing work/chores Not difficult at all   Subjective:   1. Other fatigue [name] admits to daytime somnolence and admits to waking up still tired. Patient has a history of symptoms of daytime fatigue and morning fatigue. Ronnie Jordan generally gets 7 or 8 hours of sleep per night, and states that he has nightime awakenings and generally restful sleep. Snoring is present. Apneic episodes are not present. Epworth Sleepiness Score is 8.  EKG within normal limits.  2. SOBOE (shortness of breath on exertion) Ronnie Jordan notes increasing shortness of breath with exercising and seems to be worsening over time with weight gain. He notes getting out of breath sooner with activity than he used to. This has not gotten worse recently. Ronnie Jordan denies shortness of breath at rest or orthopnea.  3. Elevated liver enzymes He has a history of elevated liver enzymes.  I reviewed previous CT scan from a few years ago and liver was reported normal.  He is on simvastatin.   4. Other hyperlipidemia Ronnie Jordan is on statin with no side effects noted.   Assessment/Plan:   1. Other fatigue Ronnie Jordan does feel that his weight is causing his energy to be lower than it should be. Fatigue may be related to obesity, depression or many other causes. Labs will be ordered, and in the meanwhile, Ronnie Jordan will focus on self care including making healthy food choices, increasing physical activity and  focusing on stress reduction.  - Vitamin B12 - CBC with Differential/Platelet - Comprehensive metabolic panel - Hemoglobin A1c - Insulin, random - T4, free - TSH - VITAMIN D 25 Hydroxy (Vit-D Deficiency, Fractures)  2. SOBOE (shortness of breath on exertion) Ronnie Jordan does feel that he gets out of breath more easily that he used to when he exercises. Ronnie Jordan's shortness of breath appears to be obesity related and exercise induced. He has agreed to work on  weight loss and gradually increase exercise to treat his exercise induced shortness of breath. Will continue to monitor closely.  3. Elevated liver enzymes We will check labs today.   I suggest he hold medication for 4 weeks and we recheck liver enzymes to see if it resolves and determine cause-and-effect relationship.  If it remains I recommend further work-up by PCP which may include an ultrasound and further serologies.  4. Other hyperlipidemia We will check labs today, and we will assess [name]'s ASCVD risk score.   - Lipid panel  5. Depression screening Ronnie Jordan had a positive depression screening. Depression is commonly associated with obesity and often results in emotional eating behaviors. We will monitor this closely and work on CBT to help improve the non-hunger eating patterns. Referral to Psychology may be required if no improvement is seen as he continues in our clinic.  6. Obesity, current BMI 31.3 Ronnie Jordan is currently in the action stage of change and his goal is to continue with weight loss efforts. I recommend Ronnie Jordan begin the structured treatment plan as follows:  He has agreed to the Category 4 Plan.  Reduced calorie, high-protein meal plan was provided.  Reviewed benefits of strength training on BMR and skeletal muscle.  Exercise goals: No exercise has been prescribed at this time.   Behavioral modification strategies: increasing lean protein intake, decreasing simple carbohydrates, increasing water intake, decreasing liquid calories, no skipping meals, meal planning and cooking strategies, better snacking choices, avoiding temptations, and planning for success.  He was informed of the importance of frequent follow-up visits to maximize his success with intensive lifestyle modifications for his multiple health conditions. He was informed we would discuss his lab results at his next visit unless there is a critical issue that needs to be addressed sooner. Ronnie Jordan agreed to keep his  next visit at the agreed upon time to discuss these results.  Objective:   Blood pressure 109/78, pulse 76, temperature 97.6 F (36.4 C), height 5\' 11"  (1.803 m), weight 224 lb (101.6 kg), SpO2 96 %. Body mass index is 31.24 kg/m.  EKG: Normal sinus rhythm, rate 78 BPM.  Indirect Calorimeter completed today shows a VO2 of 330 and a REE of 2275.  His calculated basal metabolic rate is thus his basal metabolic rate is better than expected.  General: Cooperative, alert, well developed, in no acute distress. HEENT: Conjunctivae and lids unremarkable. Cardiovascular: Regular rhythm.  Lungs: Normal work of breathing. Neurologic: No focal deficits.   Lab Results  Component Value Date   CREATININE 1.00 04/07/2022   BUN 14 04/07/2022   NA 140 04/07/2022   K 4.5 04/07/2022   CL 101 04/07/2022   CO2 25 04/07/2022   Lab Results  Component Value Date   ALT 119 (H) 04/07/2022   AST 40 04/07/2022   ALKPHOS 134 (H) 04/07/2022   BILITOT 0.3 04/07/2022   Lab Results  Component Value Date   HGBA1C 5.3 04/07/2022   Lab Results  Component Value Date   INSULIN 15.8 04/07/2022  Lab Results  Component Value Date   TSH 3.560 04/07/2022   Lab Results  Component Value Date   CHOL 157 04/07/2022   HDL 36 (L) 04/07/2022   LDLCALC 105 (H) 04/07/2022   TRIG 82 04/07/2022   CHOLHDL 4.4 04/07/2022   Lab Results  Component Value Date   WBC 5.6 04/07/2022   HGB 15.9 04/07/2022   HCT 46.5 04/07/2022   MCV 88 04/07/2022   PLT 244 04/07/2022   No results found for: "IRON", "TIBC", "FERRITIN"  Attestation Statements:   Reviewed by clinician on day of visit: allergies, medications, problem list, medical history, surgical history, family history, social history, and previous encounter notes.  Time spent on visit including pre-visit chart review and post-visit charting and care was 40 minutes.   I, Trixie Dredge, am acting as transcriptionist for Thomes Dinning, MD.  I have  reviewed the above documentation for accuracy and completeness, and I agree with the above. Thomes Dinning, MD

## 2022-04-21 ENCOUNTER — Ambulatory Visit (INDEPENDENT_AMBULATORY_CARE_PROVIDER_SITE_OTHER): Payer: BC Managed Care – PPO | Admitting: Internal Medicine

## 2022-04-21 ENCOUNTER — Encounter (INDEPENDENT_AMBULATORY_CARE_PROVIDER_SITE_OTHER): Payer: Self-pay | Admitting: Internal Medicine

## 2022-04-21 VITALS — BP 114/76 | HR 82 | Temp 99.1°F | Ht 71.0 in | Wt 221.6 lb

## 2022-04-21 DIAGNOSIS — E88819 Insulin resistance, unspecified: Secondary | ICD-10-CM | POA: Diagnosis not present

## 2022-04-21 DIAGNOSIS — Z6830 Body mass index (BMI) 30.0-30.9, adult: Secondary | ICD-10-CM

## 2022-04-21 DIAGNOSIS — E559 Vitamin D deficiency, unspecified: Secondary | ICD-10-CM | POA: Diagnosis not present

## 2022-04-21 DIAGNOSIS — R748 Abnormal levels of other serum enzymes: Secondary | ICD-10-CM

## 2022-04-21 DIAGNOSIS — E669 Obesity, unspecified: Secondary | ICD-10-CM | POA: Diagnosis not present

## 2022-04-21 NOTE — Progress Notes (Deleted)
The ASCVD Risk score (Arnett DK, et al., 2019) failed to calculate for the following reasons:    The 2019 ASCVD risk score is only valid for ages 40 to 79

## 2022-04-28 NOTE — Progress Notes (Signed)
Chief Complaint:   OBESITY Ronnie Jordan is here to discuss his progress with his obesity treatment plan along with follow-up of his obesity related diagnoses. [name] is on the Category 4 Plan and states he is following his eating plan approximately 80% of the time. [name] states he is walking and weight lifting for 30-60 minutes 1-5 times per week.  Today's visit was #: 2 Starting weight: 224 lbs Starting date: 04/07/2022 Today's weight: 221 lbs Today's date: 04/21/2022 Total lbs lost to date: 3 Total lbs lost since last in-office visit: 3  Interim History: Ronnie Jordan is doing well. Body fat decreased to 26%, muscle mass increased 2 lbs. Lack of variety at breakfast at times is a problem. Good appetite control. No significant cravings or unhealthy snacking. He is doing strength training 1 time per week.   Subjective:   1. Insulin resistance Ronnie Jordan has a new diagnosis. His fasting blood glucose and A1c are within normal limits, fasting insulin 15.8 (optimal <7). He is asymptomatic. I discussed labs with the patient today.   2. Vitamin D insufficiency Ronnie Jordan's level is low normal, target 50-60 as may contribute to adipogenesis. I discussed labs with the patient today.   3. Elevated liver enzymes Ronnie Jordan's ALT 119, AST 40, ALK 134, and he is asymptomatic. No risks for Hep C or alcohol use. Mother had elevated liver enzymes due to statin as well. I discussed labs with the patient today.   Assessment/Plan:   1. Insulin resistance [place] will continue with his weight loss therapy.   2. Vitamin D insufficiency Ronnie Jordan agreed to start Vitamin D D2 2,000 IU OTC once a day. We will recheck level in 3-4 months.   3. Elevated liver enzymes Ronnie Jordan is to hold statin for 4 weeks. Recheck levels, and if elevated off statin we will recommend additional evaluation by his PCP.   4. Obesity, current BMI 30.9 Ronnie Jordan is currently in the action stage of change. As such, his goal is to continue with weight loss  efforts. He has agreed to the Category 4 Plan.   Exercise goals: For substantial health benefits, adults should do at least 150 minutes (2 hours and 30 minutes) a week of moderate-intensity, or 75 minutes (1 hour and 15 minutes) a week of vigorous-intensity aerobic physical activity, or an equivalent combination of moderate- and vigorous-intensity aerobic activity. Aerobic activity should be performed in episodes of at least 10 minutes, and preferably, it should be spread throughout the week.  Behavioral modification strategies: meal planning and cooking strategies, avoiding temptations, and planning for success.  Ronnie Jordan has agreed to follow-up with our clinic in 2 weeks. He was informed of the importance of frequent follow-up visits to maximize his success with intensive lifestyle modifications for his multiple health conditions.   Objective:   Blood pressure 114/76, pulse 82, temperature 99.1 F (37.3 C), height _0  (1.803 m), weight 221 lb 9.6 oz (100.5 kg), SpO2 97 %. Body mass index is 30.91 kg/m.  General: Cooperative, alert, well developed, in no acute distress. HEENT: Conjunctivae and lids unremarkable. Cardiovascular: Regular rhythm.  Lungs: Normal work of breathing. Neurologic: No focal deficits.   Lab Results  Component Value Date   CREATININE 1.00 04/07/2022   BUN 14 04/07/2022   NA 140 04/07/2022   K 4.5 04/07/2022   CL 101 04/07/2022   CO2 25 04/07/2022   Lab Results  Component Value Date   ALT 119 (H) 04/07/2022   AST 40 04/07/2022   ALKPHOS 134 (H)  04/07/2022   BILITOT 0.3 04/07/2022   Lab Results  Component Value Date   HGBA1C 5.3 04/07/2022   Lab Results  Component Value Date   INSULIN 15.8 04/07/2022   Lab Results  Component Value Date   TSH 3.560 04/07/2022   Lab Results  Component Value Date   CHOL 157 04/07/2022   HDL 36 (L) 04/07/2022   LDLCALC 105 (H) 04/07/2022   TRIG 82 04/07/2022   CHOLHDL 4.4 04/07/2022   Lab Results  Component  Value Date   VD25OH 36.8 04/07/2022   Lab Results  Component Value Date   WBC 5.6 04/07/2022   HGB 15.9 04/07/2022   HCT 46.5 04/07/2022   MCV 88 04/07/2022   PLT 244 04/07/2022   No results found for: "IRON", "TIBC", "FERRITIN"  Attestation Statements:   Reviewed by clinician on day of visit: allergies, medications, problem list, medical history, surgical history, family history, social history, and previous encounter notes.  Time spent on visit including pre-visit chart review and post-visit care and charting was 40 minutes.   Wilhemena Durie, am acting as transcriptionist for Thomes Dinning, MD.  I have reviewed the above documentation for accuracy and completeness, and I agree with the above. -Thomes Dinning, MD

## 2022-05-11 ENCOUNTER — Ambulatory Visit (INDEPENDENT_AMBULATORY_CARE_PROVIDER_SITE_OTHER): Payer: BC Managed Care – PPO | Admitting: Internal Medicine

## 2022-05-11 VITALS — BP 128/81 | Ht 71.0 in | Wt 221.0 lb

## 2022-05-11 DIAGNOSIS — Z6830 Body mass index (BMI) 30.0-30.9, adult: Secondary | ICD-10-CM | POA: Diagnosis not present

## 2022-05-11 DIAGNOSIS — E669 Obesity, unspecified: Secondary | ICD-10-CM | POA: Diagnosis not present

## 2022-05-11 DIAGNOSIS — R748 Abnormal levels of other serum enzymes: Secondary | ICD-10-CM | POA: Diagnosis not present

## 2022-05-13 LAB — CMP14+EGFR
ALT: 62 IU/L — ABNORMAL HIGH (ref 0–44)
AST: 29 IU/L (ref 0–40)
Albumin/Globulin Ratio: 2.3 — ABNORMAL HIGH (ref 1.2–2.2)
Albumin: 5 g/dL (ref 4.1–5.1)
Alkaline Phosphatase: 126 IU/L — ABNORMAL HIGH (ref 44–121)
BUN/Creatinine Ratio: 17 (ref 9–20)
BUN: 18 mg/dL (ref 6–20)
Bilirubin Total: 0.2 mg/dL (ref 0.0–1.2)
CO2: 23 mmol/L (ref 20–29)
Calcium: 9.9 mg/dL (ref 8.7–10.2)
Chloride: 101 mmol/L (ref 96–106)
Creatinine, Ser: 1.03 mg/dL (ref 0.76–1.27)
Globulin, Total: 2.2 g/dL (ref 1.5–4.5)
Glucose: 91 mg/dL (ref 70–99)
Potassium: 4.7 mmol/L (ref 3.5–5.2)
Sodium: 139 mmol/L (ref 134–144)
Total Protein: 7.2 g/dL (ref 6.0–8.5)
eGFR: 95 mL/min/{1.73_m2} (ref >59)

## 2022-05-25 NOTE — Progress Notes (Signed)
Chief Complaint:   OBESITY Ronnie Jordan to discuss his progress with his obesity treatment Jordan along with follow-up of his obesity related diagnoses. Ronnie Jordan and states he is following his eating Jordan approximately 80% of the time. Ronnie Jordan states he is walking and doing weights 30-60 minutes 2-3 times per week.  Today's visit was #: 3 Starting weight: 224 lbs Starting date: 04/07/2022 Today's weight: 221 lbs Today's date: 05/11/2022 Total lbs lost to date: 3 Total lbs lost since last in-office visit: 0  Interim History: Ronnie Jordan is somewhat disappointed with lack of weight loss the last 3 weeks. He has noticed increased energy, decreased waist circumference. Pt has started strength training. He reports occasional cravings but adequate satiety. Bioimpedance assessment shows fat mass 27.1%. Pt's waist circumference decreased by 1 inch.  Subjective:   1. Elevated liver enzymes Statin on hold for 4 weeks.  If statin related which is seeing improvement in liver enzyme levels.  Asymptomatic.  Denies alcohol use and no family history of liver disease.  No history of imaging.  Assessment/Jordan:   1. Elevated liver enzymes ALT elevated. Refer to PCP for ultrasound and workup. Check CMP today.  Work-up should include hepatitis serologies, iron studies and ceruloplasmin.  - CMP14+EGFR - US Abdomen Limited RUQ (LIVER/GB)  2. Obesity, current BMI 30.9 Ronnie Jordan is currently in the action stage of change. As such, his goal is to continue with weight loss efforts. He has Jordan to the Category 4 Jordan.   Start journaling 3-4 days a week to ensure meeting caloric target.  Exercise goals:  As is  Behavioral modification strategies: increasing lean protein intake, increasing water intake, meal planning and cooking strategies, and planning for success.  Ronnie Jordan has Jordan to follow-up with our clinic in 2 weeks. He was Jordan of the importance of frequent follow-up visits to  maximize his success with intensive lifestyle modifications for his multiple health conditions.   Ronnie Jordan we would discuss his lab results at his next visit unless there is a critical issue that needs to be addressed sooner. Ronnie Jordan to keep his next visit at the Jordan upon time to discuss these results.  Objective:   Blood pressure 128/81, height _0  (1.803 m), weight 221 lb (100.2 kg). Body mass index is 30.82 kg/m.  General: Cooperative, alert, well developed, in no acute distress. HEENT: Conjunctivae and lids unremarkable. Cardiovascular: Regular rhythm.  Lungs: Normal work of breathing. Neurologic: No focal deficits.   Lab Results  Component Value Date   CREATININE 1.03 05/11/2022   BUN 18 05/11/2022   NA 139 05/11/2022   K 4.7 05/11/2022   CL 101 05/11/2022   CO2 23 05/11/2022   Lab Results  Component Value Date   ALT 62 (H) 05/11/2022   AST 29 05/11/2022   ALKPHOS 126 (H) 05/11/2022   BILITOT 0.2 05/11/2022   Lab Results  Component Value Date   HGBA1C 5.3 04/07/2022   Lab Results  Component Value Date   INSULIN 15.8 04/07/2022   Lab Results  Component Value Date   TSH 3.560 04/07/2022   Lab Results  Component Value Date   CHOL 157 04/07/2022   HDL 36 (L) 04/07/2022   LDLCALC 105 (H) 04/07/2022   TRIG 82 04/07/2022   CHOLHDL 4.4 04/07/2022   Lab Results  Component Value Date   VD25OH 36.8 04/07/2022   Lab Results  Component Value Date   WBC 5.6 04/07/2022  HGB 15.9 04/07/2022   HCT 46.5 04/07/2022   MCV 88 04/07/2022   PLT 244 04/07/2022    Attestation Statements:   Reviewed by clinician on day of visit: allergies, medications, problem list, medical history, surgical history, family history, social history, and previous encounter notes.  Time spent on visit including pre-visit chart review and post-visit care and charting was 20 minutes.   I, Kathlene November, BS, CMA, am acting as transcriptionist for Thomes Dinning,  MD.  I have reviewed the above documentation for accuracy and completeness, and I agree with the above. -Thomes Dinning, MD

## 2022-06-09 ENCOUNTER — Ambulatory Visit (INDEPENDENT_AMBULATORY_CARE_PROVIDER_SITE_OTHER): Payer: BC Managed Care – PPO | Admitting: Internal Medicine

## 2022-06-09 ENCOUNTER — Encounter (INDEPENDENT_AMBULATORY_CARE_PROVIDER_SITE_OTHER): Payer: Self-pay | Admitting: Internal Medicine

## 2022-06-09 VITALS — BP 134/84 | HR 78 | Temp 97.9°F | Ht 71.0 in | Wt 215.0 lb

## 2022-06-09 DIAGNOSIS — R748 Abnormal levels of other serum enzymes: Secondary | ICD-10-CM

## 2022-06-09 DIAGNOSIS — Z6830 Body mass index (BMI) 30.0-30.9, adult: Secondary | ICD-10-CM | POA: Diagnosis not present

## 2022-06-09 DIAGNOSIS — E66811 Obesity, class 1: Secondary | ICD-10-CM

## 2022-06-09 DIAGNOSIS — E669 Obesity, unspecified: Secondary | ICD-10-CM | POA: Diagnosis not present

## 2022-06-11 ENCOUNTER — Ambulatory Visit (HOSPITAL_BASED_OUTPATIENT_CLINIC_OR_DEPARTMENT_OTHER): Payer: BC Managed Care – PPO

## 2022-06-21 NOTE — Progress Notes (Signed)
Chief Complaint:   OBESITY Ronnie Jordan is here to discuss his progress with his obesity treatment plan along with follow-up of his obesity related diagnoses. Ronnie Jordan is on keeping a food journal and adhering to recommended goals of 1500-1800 calories and 100 grams protein and states he is following his eating plan approximately 80% of the time. Ronnie Jordan states he is walking 30-60 minutes 2 times per week.  Today's visit was #: 4 Starting weight: 224 lbs Starting date: 04/07/2022 Today's weight: 215 lbs Today's date: 06/09/2022 Total lbs lost to date: 9 Total lbs lost since last in-office visit: 6  Interim History: Ronnie Jordan has begun to journal and appreciates having flexibility. He has been targeting approximately 1500 calories, which is well below IC. He has started to lose weight. Pt notes decrease in exercise. He reports occasional cravings but no abnormal eating patterns.bioimpedance scale shows decrease in body fat and visceral fat.  Subjective:   1. Elevated liver enzymes Ultrasound is not completed   Assessment/Plan:   1. Elevated liver enzymes MA spoke with patient and he will schedule. Pt will repeat CMP in January. I recommend work-up by primary care team if still elevated.  2. Obesity, current BMI 30 Ronnie Jordan is currently in the action stage of change. As such, his goal is to continue with weight loss efforts. He has agreed to keeping a food journal and adhering to recommended goals of 1500 calories and 100 grams protein.   Exercise goals:  As is  Behavioral modification strategies: increasing lean protein intake, increasing water intake, no skipping meals, avoiding temptations, and planning for success.  Ronnie Jordan has agreed to follow-up with our clinic in 4 weeks. He was informed of the importance of frequent follow-up visits to maximize his success with intensive lifestyle modifications for his multiple health conditions.   Objective:   Blood pressure 134/84, pulse 78, temperature  97.9 F (36.6 C), height 5\' 11"  (1.803 m), weight 215 lb (97.5 kg), SpO2 99 %. Body mass index is 29.99 kg/m.  General: Cooperative, alert, well developed, in no acute distress. HEENT: Conjunctivae and lids unremarkable. Cardiovascular: Regular rhythm.  Lungs: Normal work of breathing. Neurologic: No focal deficits.   Lab Results  Component Value Date   CREATININE 1.03 05/11/2022   BUN 18 05/11/2022   NA 139 05/11/2022   K 4.7 05/11/2022   CL 101 05/11/2022   CO2 23 05/11/2022   Lab Results  Component Value Date   ALT 62 (H) 05/11/2022   AST 29 05/11/2022   ALKPHOS 126 (H) 05/11/2022   BILITOT 0.2 05/11/2022   Lab Results  Component Value Date   HGBA1C 5.3 04/07/2022   Lab Results  Component Value Date   INSULIN 15.8 04/07/2022   Lab Results  Component Value Date   TSH 3.560 04/07/2022   Lab Results  Component Value Date   CHOL 157 04/07/2022   HDL 36 (L) 04/07/2022   LDLCALC 105 (H) 04/07/2022   TRIG 82 04/07/2022   CHOLHDL 4.4 04/07/2022   Lab Results  Component Value Date   VD25OH 36.8 04/07/2022   Lab Results  Component Value Date   WBC 5.6 04/07/2022   HGB 15.9 04/07/2022   HCT 46.5 04/07/2022   MCV 88 04/07/2022   PLT 244 04/07/2022    Attestation Statements:   Reviewed by clinician on day of visit: allergies, medications, problem list, medical history, surgical history, family history, social history, and previous encounter notes.  Time spent on visit including pre-visit chart  review and post-visit care and charting was 20 minutes.   I, Kyung Rudd, BS, CMA, am acting as transcriptionist for Worthy Rancher, MD.  I have reviewed the above documentation for accuracy and completeness, and I agree with the above. -Worthy Rancher, MD

## 2022-07-14 ENCOUNTER — Ambulatory Visit (INDEPENDENT_AMBULATORY_CARE_PROVIDER_SITE_OTHER): Payer: BC Managed Care – PPO | Admitting: Internal Medicine

## 2022-09-07 DIAGNOSIS — F900 Attention-deficit hyperactivity disorder, predominantly inattentive type: Secondary | ICD-10-CM | POA: Diagnosis not present

## 2022-09-07 DIAGNOSIS — E785 Hyperlipidemia, unspecified: Secondary | ICD-10-CM | POA: Diagnosis not present

## 2022-09-07 DIAGNOSIS — F4010 Social phobia, unspecified: Secondary | ICD-10-CM | POA: Diagnosis not present

## 2022-09-07 DIAGNOSIS — E669 Obesity, unspecified: Secondary | ICD-10-CM | POA: Diagnosis not present

## 2022-10-28 DIAGNOSIS — Z79899 Other long term (current) drug therapy: Secondary | ICD-10-CM | POA: Diagnosis not present

## 2022-10-28 DIAGNOSIS — E785 Hyperlipidemia, unspecified: Secondary | ICD-10-CM | POA: Diagnosis not present

## 2022-10-28 DIAGNOSIS — R7401 Elevation of levels of liver transaminase levels: Secondary | ICD-10-CM | POA: Diagnosis not present

## 2023-08-24 DIAGNOSIS — H02889 Meibomian gland dysfunction of unspecified eye, unspecified eyelid: Secondary | ICD-10-CM | POA: Diagnosis not present

## 2023-08-24 DIAGNOSIS — H43391 Other vitreous opacities, right eye: Secondary | ICD-10-CM | POA: Diagnosis not present

## 2023-09-15 DIAGNOSIS — F3340 Major depressive disorder, recurrent, in remission, unspecified: Secondary | ICD-10-CM | POA: Diagnosis not present

## 2023-09-15 DIAGNOSIS — F900 Attention-deficit hyperactivity disorder, predominantly inattentive type: Secondary | ICD-10-CM | POA: Diagnosis not present

## 2023-09-15 DIAGNOSIS — F4010 Social phobia, unspecified: Secondary | ICD-10-CM | POA: Diagnosis not present

## 2023-09-15 DIAGNOSIS — G4700 Insomnia, unspecified: Secondary | ICD-10-CM | POA: Diagnosis not present

## 2024-03-24 DIAGNOSIS — F4010 Social phobia, unspecified: Secondary | ICD-10-CM | POA: Diagnosis not present

## 2024-03-24 DIAGNOSIS — G720 Drug-induced myopathy: Secondary | ICD-10-CM | POA: Diagnosis not present

## 2024-03-24 DIAGNOSIS — Z23 Encounter for immunization: Secondary | ICD-10-CM | POA: Diagnosis not present

## 2024-03-24 DIAGNOSIS — E785 Hyperlipidemia, unspecified: Secondary | ICD-10-CM | POA: Diagnosis not present

## 2024-03-24 DIAGNOSIS — Z0000 Encounter for general adult medical examination without abnormal findings: Secondary | ICD-10-CM | POA: Diagnosis not present

## 2024-03-24 DIAGNOSIS — F900 Attention-deficit hyperactivity disorder, predominantly inattentive type: Secondary | ICD-10-CM | POA: Diagnosis not present

## 2024-05-30 DIAGNOSIS — M25561 Pain in right knee: Secondary | ICD-10-CM | POA: Diagnosis not present
